# Patient Record
Sex: Female | Born: 1945 | Race: White | Hispanic: No | Marital: Married | Smoking: Never smoker
Health system: Southern US, Community
[De-identification: ages and names within clinical notes are randomized; demographics above are authoritative.]

## PROBLEM LIST (undated history)

## (undated) DIAGNOSIS — I878 Other specified disorders of veins: Secondary | ICD-10-CM

## (undated) DIAGNOSIS — M51369 Other intervertebral disc degeneration, lumbar region without mention of lumbar back pain or lower extremity pain: Secondary | ICD-10-CM

## (undated) DIAGNOSIS — M545 Low back pain, unspecified: Secondary | ICD-10-CM

## (undated) DIAGNOSIS — M1990 Unspecified osteoarthritis, unspecified site: Secondary | ICD-10-CM

## (undated) DIAGNOSIS — J189 Pneumonia, unspecified organism: Secondary | ICD-10-CM

## (undated) DIAGNOSIS — D126 Benign neoplasm of colon, unspecified: Secondary | ICD-10-CM

## (undated) DIAGNOSIS — R3915 Urgency of urination: Secondary | ICD-10-CM

## (undated) DIAGNOSIS — L309 Dermatitis, unspecified: Secondary | ICD-10-CM

## (undated) DIAGNOSIS — B029 Zoster without complications: Secondary | ICD-10-CM

## (undated) DIAGNOSIS — E7800 Pure hypercholesterolemia, unspecified: Secondary | ICD-10-CM

## (undated) DIAGNOSIS — A0472 Enterocolitis due to Clostridium difficile, not specified as recurrent: Secondary | ICD-10-CM

## (undated) DIAGNOSIS — N189 Chronic kidney disease, unspecified: Secondary | ICD-10-CM

## (undated) DIAGNOSIS — M5136 Other intervertebral disc degeneration, lumbar region: Secondary | ICD-10-CM

## (undated) DIAGNOSIS — M797 Fibromyalgia: Secondary | ICD-10-CM

## (undated) DIAGNOSIS — E669 Obesity, unspecified: Secondary | ICD-10-CM

## (undated) DIAGNOSIS — G8929 Other chronic pain: Secondary | ICD-10-CM

## (undated) DIAGNOSIS — M8580 Other specified disorders of bone density and structure, unspecified site: Secondary | ICD-10-CM

## (undated) DIAGNOSIS — F329 Major depressive disorder, single episode, unspecified: Secondary | ICD-10-CM

## (undated) DIAGNOSIS — R911 Solitary pulmonary nodule: Secondary | ICD-10-CM

## (undated) DIAGNOSIS — R0602 Shortness of breath: Secondary | ICD-10-CM

## (undated) DIAGNOSIS — D649 Anemia, unspecified: Secondary | ICD-10-CM

## (undated) DIAGNOSIS — K635 Polyp of colon: Secondary | ICD-10-CM

## (undated) DIAGNOSIS — R17 Unspecified jaundice: Secondary | ICD-10-CM

## (undated) DIAGNOSIS — E559 Vitamin D deficiency, unspecified: Secondary | ICD-10-CM

## (undated) DIAGNOSIS — E119 Type 2 diabetes mellitus without complications: Secondary | ICD-10-CM

## (undated) DIAGNOSIS — F32A Depression, unspecified: Secondary | ICD-10-CM

## (undated) HISTORY — PX: EYE SURGERY: SHX253

## (undated) HISTORY — PX: OOPHORECTOMY: SHX86

## (undated) HISTORY — PX: NASAL RECONSTRUCTION WITH SEPTAL REPAIR: SHX5665

## (undated) HISTORY — PX: TOTAL KNEE ARTHROPLASTY: SHX125

## (undated) HISTORY — PX: ORIF ANKLE FRACTURE: SHX5408

## (undated) HISTORY — PX: BREAST BIOPSY: SHX20

## (undated) HISTORY — PX: ANKLE FRACTURE SURGERY: SHX122

## (undated) HISTORY — PX: FRACTURE SURGERY: SHX138

## (undated) HISTORY — PX: BACK SURGERY: SHX140

## (undated) HISTORY — PX: CATARACT EXTRACTION W/ INTRAOCULAR LENS  IMPLANT, BILATERAL: SHX1307

## (undated) HISTORY — PX: OTHER SURGICAL HISTORY: SHX169

---
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
## 1964-09-10 HISTORY — PX: CHOLECYSTECTOMY: SHX55

## 1972-09-10 HISTORY — PX: ABDOMINAL HYSTERECTOMY: SHX81

## 2004-09-10 HISTORY — PX: KNEE ARTHROSCOPY: SHX127

## 2005-05-29 ENCOUNTER — Ambulatory Visit: Payer: Self-pay | Admitting: Family Medicine

## 2006-09-04 ENCOUNTER — Ambulatory Visit: Payer: Self-pay | Admitting: Obstetrics and Gynecology

## 2007-10-22 ENCOUNTER — Ambulatory Visit: Payer: Self-pay | Admitting: Rheumatology

## 2009-03-10 ENCOUNTER — Ambulatory Visit: Payer: Self-pay | Admitting: Family Medicine

## 2010-01-06 ENCOUNTER — Ambulatory Visit: Payer: Self-pay | Admitting: Unknown Physician Specialty

## 2010-03-20 ENCOUNTER — Ambulatory Visit: Payer: Self-pay | Admitting: Family Medicine

## 2010-12-28 ENCOUNTER — Ambulatory Visit: Payer: Self-pay | Admitting: Sports Medicine

## 2011-02-09 HISTORY — PX: MICRODISCECTOMY LUMBAR: SUR864

## 2011-02-16 ENCOUNTER — Ambulatory Visit: Payer: Self-pay | Admitting: Unknown Physician Specialty

## 2011-02-16 DIAGNOSIS — Z01810 Encounter for preprocedural cardiovascular examination: Secondary | ICD-10-CM

## 2011-02-22 ENCOUNTER — Ambulatory Visit: Payer: Self-pay | Admitting: Unknown Physician Specialty

## 2011-05-17 ENCOUNTER — Ambulatory Visit: Payer: Self-pay | Admitting: Family Medicine

## 2011-10-02 ENCOUNTER — Ambulatory Visit: Payer: Self-pay | Admitting: Unknown Physician Specialty

## 2011-10-02 LAB — CREATININE, SERUM
Creatinine: 0.88 mg/dL (ref 0.60–1.30)
EGFR (African American): >60
EGFR (Non-African Amer.): >60

## 2012-01-02 ENCOUNTER — Encounter (HOSPITAL_COMMUNITY): Payer: Self-pay | Admitting: Pharmacy Technician

## 2012-01-02 ENCOUNTER — Other Ambulatory Visit: Payer: Self-pay | Admitting: Neurosurgery

## 2012-01-10 ENCOUNTER — Other Ambulatory Visit: Payer: Self-pay | Admitting: Neurosurgery

## 2012-01-10 ENCOUNTER — Ambulatory Visit (HOSPITAL_COMMUNITY)
Admission: RE | Admit: 2012-01-10 | Discharge: 2012-01-10 | Disposition: A | Discharge status: Home / Self Care | Payer: Medicare Other | Source: Ambulatory Visit | Attending: Neurosurgery | Admitting: Neurosurgery

## 2012-01-10 ENCOUNTER — Encounter (HOSPITAL_COMMUNITY): Payer: Self-pay

## 2012-01-10 ENCOUNTER — Encounter (HOSPITAL_COMMUNITY)
Admission: RE | Admit: 2012-01-10 | Discharge: 2012-01-10 | Disposition: A | Discharge status: Home / Self Care | Payer: Medicare Other | Source: Ambulatory Visit | Attending: Neurosurgery | Admitting: Neurosurgery

## 2012-01-10 DIAGNOSIS — Z01812 Encounter for preprocedural laboratory examination: Secondary | ICD-10-CM | POA: Insufficient documentation

## 2012-01-10 DIAGNOSIS — Z01811 Encounter for preprocedural respiratory examination: Secondary | ICD-10-CM | POA: Insufficient documentation

## 2012-01-10 DIAGNOSIS — Z01810 Encounter for preprocedural cardiovascular examination: Secondary | ICD-10-CM | POA: Insufficient documentation

## 2012-01-10 DIAGNOSIS — Z01818 Encounter for other preprocedural examination: Secondary | ICD-10-CM | POA: Insufficient documentation

## 2012-01-10 HISTORY — DX: Unspecified osteoarthritis, unspecified site: M19.90

## 2012-01-10 HISTORY — DX: Pneumonia, unspecified organism: J18.9

## 2012-01-10 HISTORY — DX: Shortness of breath: R06.02

## 2012-01-10 LAB — BASIC METABOLIC PANEL
CO2: 26 mEq/L (ref 19–32)
Calcium: 9.8 mg/dL (ref 8.4–10.5)
Creatinine, Ser: 0.87 mg/dL (ref 0.50–1.10)
GFR calc non Af Amer: 68 mL/min — ABNORMAL LOW (ref >90)

## 2012-01-10 LAB — CBC
MCV: 89.2 fL (ref 78.0–100.0)
Platelets: 280 10*3/uL (ref 150–400)
RDW: 14.1 % (ref 11.5–15.5)
WBC: 11.1 10*3/uL — ABNORMAL HIGH (ref 4.0–10.5)

## 2012-01-10 LAB — TYPE AND SCREEN

## 2012-01-10 LAB — SURGICAL PCR SCREEN
MRSA, PCR: NEGATIVE
Staphylococcus aureus: NEGATIVE

## 2012-01-10 NOTE — Pre-Procedure Instructions (Signed)
20 Courtney Cherry  01/10/2012   Your procedure is scheduled on:  May 16. 2013 0730 AM  Report to [facility] at 0530 AM.  Call this number if you have problems the morning of surgery: [phone]   Remember:   Do not eat food:After Midnight.  May have clear liquids: up to 4 Hours before arrival.0130 AM  Clear liquids include soda, tea, black coffee, apple or grape juice, broth.  Take these medicines the morning of surgery with A SIP OF WATER: Vicodin   Do not wear jewelry, make-up or nail polish.  Do not wear lotions, powders, or perfumes. You may wear deodorant.  Do not shave 48 hours prior to surgery.  Do not bring valuables to the hospital.  Contacts, dentures or bridgework may not be worn into surgery.  Leave suitcase in the car. After surgery it may be brought to your room.  For patients admitted to the hospital, checkout time is 11:00 AM the day of discharge.   Patients discharged the day of surgery will not be allowed to drive home.    Special Instructions: CHG Shower Use Special Wash: 1/2 bottle night before surgery and 1/2 bottle morning of surgery.   Please read over the following fact sheets that you were given: Pain Booklet, Coughing and Deep Breathing, Blood Transfusion Information, MRSA Information and Surgical Site Infection Prevention

## 2012-01-17 ENCOUNTER — Other Ambulatory Visit: Payer: Self-pay | Admitting: Neurosurgery

## 2012-01-31 ENCOUNTER — Encounter (HOSPITAL_COMMUNITY): Payer: Self-pay | Admitting: *Deleted

## 2012-01-31 MED ORDER — DEXAMETHASONE SODIUM PHOSPHATE 10 MG/ML IJ SOLN: 10.0000 mg | INTRAMUSCULAR | Status: DC

## 2012-01-31 MED ORDER — DIPHENHYDRAMINE HCL 50 MG/ML IJ SOLN: 50.0000 mg | INTRAMUSCULAR | Status: DC

## 2012-01-31 MED ORDER — VANCOMYCIN HCL 500 MG IV SOLR
500.0000 mg | INTRAVENOUS | Status: DC
  Filled 2012-01-31: qty 500

## 2012-01-31 MED ORDER — TOBRAMYCIN SULFATE 80 MG/2ML IJ SOLN
80.0000 mg | INTRAVENOUS | Status: DC
  Filled 2012-01-31: qty 2

## 2012-01-31 NOTE — Pre-Procedure Instructions (Signed)
20 Courtney Cherry  01/31/2012   Your procedure is scheduled on:  Friday MAY 24TH   Report to [facility] at  0830 AM.  Call this number if you have problems the morning of surgery: [phone]   Remember:   Do not eat food:After Midnight.  THURSDAY  May have clear liquids: up to 4 Hours before arrival.  Clear liquids include soda, tea, black coffee, apple or grape juice, broth.  Take these medicines the morning of surgery with A SIP OF WATER: PAIN MEDICATIONS   Do not wear jewelry, make-up or nail polish.  Do not wear lotions, powders, or perfumes. You may wear deodorant.  Do not shave 48 hours prior to surgery. Men may shave face and neck.  Do not bring valuables to the hospital.  Contacts, dentures or bridgework may not be worn into surgery.  Leave suitcase in the car. After surgery it may be brought to your room.  For patients admitted to the hospital, checkout time is 11:00 AM the day of discharge.   Patients discharged the day of surgery will not be allowed to drive home.  Name and phone number of your driver   Courtney Cherry  --  SPOUSE  Special Instructions: CHG Shower Use Special Wash: 1/2 bottle night before surgery and 1/2 bottle morning of surgery.   Please read over the following fact sheets that you were given: Pain Booklet, MRSA Information and Surgical Site Infection Prevention     Pt  Has this information from prior work up.

## 2012-02-01 ENCOUNTER — Encounter (HOSPITAL_COMMUNITY): Payer: Self-pay | Admitting: *Deleted

## 2012-02-01 ENCOUNTER — Encounter (HOSPITAL_COMMUNITY)
Admission: RE | Disposition: A | Discharge status: Home / Self Care | Payer: Self-pay | Source: Ambulatory Visit | Attending: Neurosurgery

## 2012-02-01 ENCOUNTER — Ambulatory Visit (HOSPITAL_COMMUNITY): Payer: Medicare Other | Admitting: Anesthesiology

## 2012-02-01 ENCOUNTER — Inpatient Hospital Stay (HOSPITAL_COMMUNITY)
Admission: RE | Admit: 2012-02-01 | Discharge: 2012-02-03 | DRG: 460 | Disposition: A | Discharge status: Home / Self Care | Payer: Medicare Other | Source: Ambulatory Visit | Attending: Neurosurgery | Admitting: Neurosurgery

## 2012-02-01 ENCOUNTER — Encounter (HOSPITAL_COMMUNITY): Payer: Self-pay | Admitting: Anesthesiology

## 2012-02-01 ENCOUNTER — Encounter (HOSPITAL_COMMUNITY): Payer: Self-pay | Admitting: General Practice

## 2012-02-01 ENCOUNTER — Ambulatory Visit (HOSPITAL_COMMUNITY): Payer: Medicare Other

## 2012-02-01 DIAGNOSIS — Z882 Allergy status to sulfonamides status: Secondary | ICD-10-CM

## 2012-02-01 DIAGNOSIS — M47817 Spondylosis without myelopathy or radiculopathy, lumbosacral region: Principal | ICD-10-CM | POA: Diagnosis present

## 2012-02-01 DIAGNOSIS — Z79899 Other long term (current) drug therapy: Secondary | ICD-10-CM

## 2012-02-01 DIAGNOSIS — Z981 Arthrodesis status: Secondary | ICD-10-CM

## 2012-02-01 DIAGNOSIS — M5126 Other intervertebral disc displacement, lumbar region: Secondary | ICD-10-CM | POA: Diagnosis present

## 2012-02-01 HISTORY — DX: Depression, unspecified: F32.A

## 2012-02-01 HISTORY — DX: Fibromyalgia: M79.7

## 2012-02-01 HISTORY — DX: Major depressive disorder, single episode, unspecified: F32.9

## 2012-02-01 HISTORY — DX: Low back pain, unspecified: M54.50

## 2012-02-01 HISTORY — DX: Other chronic pain: G89.29

## 2012-02-01 HISTORY — DX: Unspecified jaundice: R17

## 2012-02-01 HISTORY — PX: POSTERIOR FUSION LUMBAR SPINE: SUR632

## 2012-02-01 HISTORY — DX: Anemia, unspecified: D64.9

## 2012-02-01 HISTORY — DX: Low back pain: M54.5

## 2012-02-01 HISTORY — DX: Urgency of urination: R39.15

## 2012-02-01 LAB — TYPE AND SCREEN

## 2012-02-01 LAB — CBC
HCT: 42.1 % (ref 36.0–46.0)
MCH: 29.8 pg (ref 26.0–34.0)
MCHC: 33.5 g/dL (ref 30.0–36.0)
MCV: 89 fL (ref 78.0–100.0)
Platelets: 271 10*3/uL (ref 150–400)
RDW: 13.9 % (ref 11.5–15.5)

## 2012-02-01 LAB — BASIC METABOLIC PANEL
BUN: 13 mg/dL (ref 6–23)
CO2: 23 mEq/L (ref 19–32)
Calcium: 9.7 mg/dL (ref 8.4–10.5)
Chloride: 105 mEq/L (ref 96–112)
Creatinine, Ser: 0.85 mg/dL (ref 0.50–1.10)
Glucose, Bld: 117 mg/dL — ABNORMAL HIGH (ref 70–99)

## 2012-02-01 SURGERY — POSTERIOR LUMBAR FUSION 2 LEVEL
Anesthesia: General | Site: Spine Lumbar | Wound class: Clean

## 2012-02-01 MED ORDER — MENTHOL 3 MG MT LOZG: 1.0000 | LOZENGE | OROMUCOSAL | Status: DC | PRN

## 2012-02-01 MED ORDER — DIPHENHYDRAMINE HCL 50 MG/ML IJ SOLN: 50.0000 mg | INTRAMUSCULAR | Status: DC

## 2012-02-01 MED ORDER — DIPHENHYDRAMINE HCL 50 MG/ML IJ SOLN
INTRAMUSCULAR | Status: AC
  Administered 2012-02-01: 50 mg via INTRAVENOUS
  Filled 2012-02-01: qty 1

## 2012-02-01 MED ORDER — SODIUM CHLORIDE 0.9 % IV SOLN
INTRAVENOUS | Status: AC
  Filled 2012-02-01: qty 500

## 2012-02-01 MED ORDER — PHENOL 1.4 % MT LIQD: 1.0000 | OROMUCOSAL | Status: DC | PRN

## 2012-02-01 MED ORDER — ACETAMINOPHEN 650 MG RE SUPP: 650.0000 mg | RECTAL | Status: DC | PRN

## 2012-02-01 MED ORDER — SODIUM CHLORIDE 0.9 % IJ SOLN
3.0000 mL | Freq: Two times a day (BID) | INTRAMUSCULAR | Status: DC
  Administered 2012-02-01 – 2012-02-02 (×2): 3 mL via INTRAVENOUS

## 2012-02-01 MED ORDER — VANCOMYCIN HCL IN DEXTROSE 1-5 GM/200ML-% IV SOLN
1000.0000 mg | Freq: Two times a day (BID) | INTRAVENOUS | Status: DC
  Administered 2012-02-01 – 2012-02-03 (×4): 1000 mg via INTRAVENOUS
  Filled 2012-02-01 (×5): qty 200

## 2012-02-01 MED ORDER — HYDROMORPHONE HCL PF 1 MG/ML IJ SOLN
INTRAMUSCULAR | Status: AC
  Filled 2012-02-01: qty 1

## 2012-02-01 MED ORDER — DEXAMETHASONE SODIUM PHOSPHATE 4 MG/ML IJ SOLN
4.0000 mg | Freq: Four times a day (QID) | INTRAMUSCULAR | Status: AC
  Administered 2012-02-01: 4 mg via INTRAVENOUS
  Filled 2012-02-01: qty 1

## 2012-02-01 MED ORDER — DEXAMETHASONE SODIUM PHOSPHATE 10 MG/ML IJ SOLN: 10.0000 mg | INTRAMUSCULAR | Status: DC

## 2012-02-01 MED ORDER — PROPOFOL 10 MG/ML IV EMUL
INTRAVENOUS | Status: DC | PRN
  Administered 2012-02-01: 150 mg via INTRAVENOUS

## 2012-02-01 MED ORDER — BACITRACIN 50000 UNITS IM SOLR
INTRAMUSCULAR | Status: AC
  Filled 2012-02-01: qty 1

## 2012-02-01 MED ORDER — NEOSTIGMINE METHYLSULFATE 1 MG/ML IJ SOLN
INTRAMUSCULAR | Status: DC | PRN
  Administered 2012-02-01: 5 mg via INTRAVENOUS

## 2012-02-01 MED ORDER — PHENYLEPHRINE HCL 10 MG/ML IJ SOLN
20.0000 mg | INTRAMUSCULAR | Status: DC | PRN
  Administered 2012-02-01: 35 ug/min via INTRAVENOUS

## 2012-02-01 MED ORDER — LACTATED RINGERS IV SOLN
INTRAVENOUS | Status: DC | PRN
  Administered 2012-02-01 (×2): via INTRAVENOUS

## 2012-02-01 MED ORDER — SODIUM CHLORIDE 0.9 % IV SOLN
INTRAVENOUS | Status: DC | PRN
  Administered 2012-02-01: 16:00:00 via INTRAVENOUS

## 2012-02-01 MED ORDER — ONDANSETRON HCL 4 MG/2ML IJ SOLN: 4.0000 mg | INTRAMUSCULAR | Status: DC | PRN

## 2012-02-01 MED ORDER — DEXAMETHASONE 4 MG PO TABS
4.0000 mg | ORAL_TABLET | Freq: Four times a day (QID) | ORAL | Status: AC
  Administered 2012-02-02: 4 mg via ORAL
  Filled 2012-02-01 (×2): qty 1

## 2012-02-01 MED ORDER — MORPHINE SULFATE 4 MG/ML IJ SOLN: 0.0500 mg/kg | INTRAMUSCULAR | Status: DC | PRN

## 2012-02-01 MED ORDER — ONDANSETRON HCL 4 MG/2ML IJ SOLN
INTRAMUSCULAR | Status: DC | PRN
  Administered 2012-02-01: 4 mg via INTRAVENOUS

## 2012-02-01 MED ORDER — ACETAMINOPHEN 325 MG PO TABS: 650.0000 mg | ORAL_TABLET | ORAL | Status: DC | PRN

## 2012-02-01 MED ORDER — HYDROMORPHONE HCL PF 1 MG/ML IJ SOLN
0.2500 mg | INTRAMUSCULAR | Status: DC | PRN
  Administered 2012-02-01 (×2): 0.5 mg via INTRAVENOUS

## 2012-02-01 MED ORDER — CYCLOBENZAPRINE HCL 10 MG PO TABS
10.0000 mg | ORAL_TABLET | Freq: Three times a day (TID) | ORAL | Status: DC | PRN
  Administered 2012-02-01: 10 mg via ORAL
  Filled 2012-02-01: qty 1

## 2012-02-01 MED ORDER — HETASTARCH-ELECTROLYTES 6 % IV SOLN
INTRAVENOUS | Status: DC | PRN
  Administered 2012-02-01 (×2): via INTRAVENOUS

## 2012-02-01 MED ORDER — DEXAMETHASONE SODIUM PHOSPHATE 10 MG/ML IJ SOLN
INTRAMUSCULAR | Status: AC
  Administered 2012-02-01: 10 mg via INTRAVENOUS
  Filled 2012-02-01: qty 1

## 2012-02-01 MED ORDER — BUPIVACAINE HCL (PF) 0.5 % IJ SOLN
INTRAMUSCULAR | Status: DC | PRN
  Administered 2012-02-01: 30 mL

## 2012-02-01 MED ORDER — ZOLPIDEM TARTRATE 5 MG PO TABS: 5.0000 mg | ORAL_TABLET | Freq: Every evening | ORAL | Status: DC | PRN

## 2012-02-01 MED ORDER — HYDROCODONE-ACETAMINOPHEN 5-325 MG PO TABS
1.0000 | ORAL_TABLET | ORAL | Status: DC | PRN
  Administered 2012-02-01: 1 via ORAL
  Administered 2012-02-02 – 2012-02-03 (×5): 2 via ORAL
  Filled 2012-02-01 (×6): qty 2

## 2012-02-01 MED ORDER — THROMBIN 20000 UNITS EX KIT
PACK | CUTANEOUS | Status: DC | PRN
  Administered 2012-02-01: 13:00:00 via TOPICAL

## 2012-02-01 MED ORDER — SUFENTANIL CITRATE 50 MCG/ML IV SOLN
INTRAVENOUS | Status: DC | PRN
  Administered 2012-02-01 (×3): 10 ug via INTRAVENOUS
  Administered 2012-02-01: 5 ug via INTRAVENOUS
  Administered 2012-02-01 (×2): 10 ug via INTRAVENOUS
  Administered 2012-02-01: 5 ug via INTRAVENOUS
  Administered 2012-02-01: 20 ug via INTRAVENOUS
  Administered 2012-02-01 (×2): 10 ug via INTRAVENOUS

## 2012-02-01 MED ORDER — KCL IN DEXTROSE-NACL 20-5-0.45 MEQ/L-%-% IV SOLN
80.0000 mL/h | INTRAVENOUS | Status: DC
  Administered 2012-02-01: 80 mL/h via INTRAVENOUS
  Filled 2012-02-01 (×5): qty 1000

## 2012-02-01 MED ORDER — VANCOMYCIN HCL 500 MG IV SOLR
500.0000 mg | INTRAVENOUS | Status: AC
  Administered 2012-02-01: 500 mg via INTRAVENOUS
  Filled 2012-02-01: qty 500

## 2012-02-01 MED ORDER — PANTOPRAZOLE SODIUM 40 MG IV SOLR
40.0000 mg | Freq: Every day | INTRAVENOUS | Status: DC
Start: 2012-02-01 — End: 2012-02-03
  Administered 2012-02-01 – 2012-02-02 (×2): 40 mg via INTRAVENOUS
  Filled 2012-02-01 (×3): qty 40

## 2012-02-01 MED ORDER — GLYCOPYRROLATE 0.2 MG/ML IJ SOLN
INTRAMUSCULAR | Status: DC | PRN
  Administered 2012-02-01: .7 mg via INTRAVENOUS

## 2012-02-01 MED ORDER — TOBRAMYCIN SULFATE 80 MG/2ML IJ SOLN
80.0000 mg | INTRAVENOUS | Status: AC
  Administered 2012-02-01: 80 mg via INTRAVENOUS
  Filled 2012-02-01: qty 2

## 2012-02-01 MED ORDER — SODIUM CHLORIDE 0.9 % IV SOLN: 250.0000 mL | INTRAVENOUS | Status: DC

## 2012-02-01 MED ORDER — ROCURONIUM BROMIDE 100 MG/10ML IV SOLN
INTRAVENOUS | Status: DC | PRN
  Administered 2012-02-01: 15 mg via INTRAVENOUS
  Administered 2012-02-01: 5 mg via INTRAVENOUS
  Administered 2012-02-01: 20 mg via INTRAVENOUS
  Administered 2012-02-01 (×4): 10 mg via INTRAVENOUS
  Administered 2012-02-01: 50 mg via INTRAVENOUS
  Administered 2012-02-01: 10 mg via INTRAVENOUS

## 2012-02-01 MED ORDER — SODIUM CHLORIDE 0.9 % IJ SOLN: 3.0000 mL | INTRAMUSCULAR | Status: DC | PRN

## 2012-02-01 MED ORDER — HYDROMORPHONE HCL PF 1 MG/ML IJ SOLN
1.0000 mg | INTRAMUSCULAR | Status: DC | PRN
  Administered 2012-02-01: 1 mg via INTRAMUSCULAR
  Filled 2012-02-01: qty 1

## 2012-02-01 MED ORDER — SODIUM CHLORIDE 0.9 % IR SOLN
Status: DC | PRN
  Administered 2012-02-01: 13:00:00

## 2012-02-01 MED ORDER — 0.9 % SODIUM CHLORIDE (POUR BTL) OPTIME
TOPICAL | Status: DC | PRN
  Administered 2012-02-01: 1000 mL

## 2012-02-01 SURGICAL SUPPLY — 65 items
BAG DECANTER FOR FLEXI CONT (MISCELLANEOUS) ×2 IMPLANT
BENZOIN TINCTURE PRP APPL 2/3 (GAUZE/BANDAGES/DRESSINGS) ×2 IMPLANT
BLADE SURG ROTATE 9660 (MISCELLANEOUS) IMPLANT
BONE EQUIVA 10CC (Bone Implant) ×2 IMPLANT
BRUSH SCRUB EZ PLAIN DRY (MISCELLANEOUS) ×2 IMPLANT
CANISTER SUCTION 2500CC (MISCELLANEOUS) ×2 IMPLANT
CLOTH BEACON ORANGE TIMEOUT ST (SAFETY) ×2 IMPLANT
CONT SPEC 4OZ CLIKSEAL STRL BL (MISCELLANEOUS) ×4 IMPLANT
COVER BACK TABLE 24X17X13 BIG (DRAPES) IMPLANT
COVER TABLE BACK 60X90 (DRAPES) ×2 IMPLANT
DERMABOND ADVANCED (GAUZE/BANDAGES/DRESSINGS)
DERMABOND ADVANCED .7 DNX12 (GAUZE/BANDAGES/DRESSINGS) IMPLANT
DRAPE C-ARM 42X72 X-RAY (DRAPES) ×4 IMPLANT
DRAPE LAPAROTOMY 100X72X124 (DRAPES) ×2 IMPLANT
DRAPE SURG 17X23 STRL (DRAPES) ×4 IMPLANT
DRESSING TELFA 8X3 (GAUZE/BANDAGES/DRESSINGS) ×2 IMPLANT
ELECT REM PT RETURN 9FT ADLT (ELECTROSURGICAL) ×2
ELECTRODE REM PT RTRN 9FT ADLT (ELECTROSURGICAL) ×1 IMPLANT
EVACUATOR 1/8 PVC DRAIN (DRAIN) ×2 IMPLANT
GAUZE SPONGE 4X4 16PLY XRAY LF (GAUZE/BANDAGES/DRESSINGS) IMPLANT
GLOVE BIO SURGEON STRL SZ8 (GLOVE) ×2 IMPLANT
GLOVE BIOGEL PI IND STRL 7.0 (GLOVE) ×1 IMPLANT
GLOVE BIOGEL PI IND STRL 7.5 (GLOVE) ×1 IMPLANT
GLOVE BIOGEL PI IND STRL 8.5 (GLOVE) ×3 IMPLANT
GLOVE BIOGEL PI INDICATOR 7.0 (GLOVE) ×1
GLOVE BIOGEL PI INDICATOR 7.5 (GLOVE) ×1
GLOVE BIOGEL PI INDICATOR 8.5 (GLOVE) ×3
GLOVE ECLIPSE 7.5 STRL STRAW (GLOVE) ×8 IMPLANT
GLOVE EXAM NITRILE MD LF STRL (GLOVE) ×2 IMPLANT
GLOVE SURG SS PI 8.0 STRL IVOR (GLOVE) ×6 IMPLANT
GOWN BRE IMP SLV AUR LG STRL (GOWN DISPOSABLE) IMPLANT
GOWN BRE IMP SLV AUR XL STRL (GOWN DISPOSABLE) IMPLANT
GOWN STRL REIN 2XL LVL4 (GOWN DISPOSABLE) IMPLANT
GRAFT PLIF 10X9MM (Graft) ×2 IMPLANT
KIT BASIN OR (CUSTOM PROCEDURE TRAY) ×2 IMPLANT
KIT ROOM TURNOVER OR (KITS) ×2 IMPLANT
LUCENT STRAIGHT 10X22X10 (Set) ×2 IMPLANT
MARKER SKIN DUAL TIP RULER LAB (MISCELLANEOUS) ×2 IMPLANT
NEEDLE HYPO 22GX1.5 SAFETY (NEEDLE) ×2 IMPLANT
NS IRRIG 1000ML POUR BTL (IV SOLUTION) ×2 IMPLANT
PACK LAMINECTOMY NEURO (CUSTOM PROCEDURE TRAY) ×2 IMPLANT
PAD ARMBOARD 7.5X6 YLW CONV (MISCELLANEOUS) ×10 IMPLANT
PATTIES SURGICAL .75X.75 (GAUZE/BANDAGES/DRESSINGS) ×2 IMPLANT
ROD PREBENT 75MM (Rod) ×4 IMPLANT
SCREW SEQUOIA 6.5X35MM (Screw) ×4 IMPLANT
SCREW SEQUOIA 7.5X40 (Screw) ×8 IMPLANT
SPEEDLINK LRG (Rod) ×2 IMPLANT
SPONGE GAUZE 4X4 12PLY (GAUZE/BANDAGES/DRESSINGS) ×2 IMPLANT
SPONGE LAP 4X18 X RAY DECT (DISPOSABLE) IMPLANT
SPONGE SURGIFOAM ABS GEL 100 (HEMOSTASIS) ×2 IMPLANT
STRIP CLOSURE SKIN 1/2X4 (GAUZE/BANDAGES/DRESSINGS) ×2 IMPLANT
SUT VIC AB 0 CT1 18XCR BRD8 (SUTURE) ×1 IMPLANT
SUT VIC AB 0 CT1 8-18 (SUTURE) ×1
SUT VIC AB 2-0 OS6 18 (SUTURE) ×6 IMPLANT
SUT VIC AB 3-0 CP2 18 (SUTURE) ×2 IMPLANT
SYR 20ML ECCENTRIC (SYRINGE) ×2 IMPLANT
TAPE CLOTH SURG 4X10 WHT LF (GAUZE/BANDAGES/DRESSINGS) ×2 IMPLANT
TOOL FLUTED BALL 7MM (MISCELLANEOUS) ×2 IMPLANT
TOOL MATCHSTK 3MM (MISCELLANEOUS) ×2 IMPLANT
TOP CLSR SEQUOIA (Orthopedic Implant) ×12 IMPLANT
TOWEL OR 17X24 6PK STRL BLUE (TOWEL DISPOSABLE) IMPLANT
TOWEL OR 17X26 10 PK STRL BLUE (TOWEL DISPOSABLE) IMPLANT
TRAP SPECIMEN MUCOUS 40CC (MISCELLANEOUS) ×2 IMPLANT
TRAY FOLEY CATH 14FRSI W/METER (CATHETERS) ×2 IMPLANT
WATER STERILE IRR 1000ML POUR (IV SOLUTION) ×2 IMPLANT

## 2012-02-01 NOTE — Brief Op Note (Signed)
02/01/2012  4:25 PM  PATIENT:  Courtney Cherry  66 y.o. female  PRE-OPERATIVE DIAGNOSIS:  stenosis/listhesis  POST-OPERATIVE DIAGNOSIS:  Stenosis/Listhesis  PROCEDURE:  Procedure(s) (LRB): POSTERIOR LUMBAR FUSION 2 LEVEL (N/A)  SURGEON:  Surgeon(s) and Role:    * Reinaldo Meeker, MD - Primary    * Tia Alert, MD - Assisting  PHYSICIAN ASSISTANT:   ASSISTANTS: Yetta Barre   ANESTHESIA:   general  EBL:  Total I/O In: 2300 [I.V.:1200; Blood:100; IV Piggyback:1000] Out: 900 [Urine:550; Blood:350]  BLOOD ADMINISTERED:none  DRAINS: (medium) Hemovact drain(s) in the epidural with  Suction Open   LOCAL MEDICATIONS USED:  MARCAINE     SPECIMEN:  No Specimen  DISPOSITION OF SPECIMEN:  N/A  COUNTS:  YES  TOURNIQUET:  * No tourniquets in log *  DICTATION: .Dragon Dictation  PLAN OF CARE: Admit to inpatient   PATIENT DISPOSITION:  PACU - hemodynamically stable.   Delay start of Pharmacological VTE agent (>24hrs) due to surgical blood loss or risk of bleeding: yes

## 2012-02-01 NOTE — Anesthesia Preprocedure Evaluation (Signed)
Anesthesia Evaluation  Patient identified by MRN, date of birth, ID band Patient awake    Airway Mallampati: II      Dental   Pulmonary          Cardiovascular negative cardio ROS  Rhythm:Regular Rate:Normal     Neuro/Psych    GI/Hepatic negative GI ROS, Neg liver ROS,   Endo/Other  negative endocrine ROS  Renal/GU negative Renal ROS     Musculoskeletal   Abdominal   Peds  Hematology   Anesthesia Other Findings   Reproductive/Obstetrics                           Anesthesia Physical Anesthesia Plan  ASA: III  Anesthesia Plan: General   Post-op Pain Management:    Induction:   Airway Management Planned: Oral ETT  Additional Equipment:   Intra-op Plan:   Post-operative Plan: Extubation in OR  Informed Consent: I have reviewed the patients History and Physical, chart, labs and discussed the procedure including the risks, benefits and alternatives for the proposed anesthesia with the patient or authorized representative who has indicated his/her understanding and acceptance.     Plan Discussed with:   Anesthesia Plan Comments:         Anesthesia Quick Evaluation

## 2012-02-01 NOTE — Progress Notes (Signed)
ANTIBIOTIC CONSULT NOTE - INITIAL  Pharmacy Consult for vancomycin Indication: post-op prophylaxis  Allergies  Allergen Reactions  . Adhesive (Tape)   . Sulfa Antibiotics   . Valium (Diazepam)     Patient Measurements: Weight: 243 lb (110.224 kg)  Vital Signs: Temp: 97.4 F (36.3 C) (05/24 1739) Temp src: Oral (05/24 1739) BP: 148/79 mmHg (05/24 1739) Pulse Rate: 87  (05/24 1739) Intake/Output from previous day:   Intake/Output from this shift: Total I/O In: 2300 [I.V.:1200; Blood:100; IV Piggyback:1000] Out: 1060 [Urine:660; Drains:50; Blood:350]  Labs:  [facility] 02/01/12 0913  WBC 7.4  HGB 14.1  PLT 271  LABCREA --  CREATININE 0.85   CrCl is unknown because there is no height on file for the current visit. No results found for this basename: VANCOTROUGH:2,VANCOPEAK:2,VANCORANDOM:2,GENTTROUGH:2,GENTPEAK:2,GENTRANDOM:2,TOBRATROUGH:2,TOBRAPEAK:2,TOBRARND:2,AMIKACINPEAK:2,AMIKACINTROU:2,AMIKACIN:2, in the last 72 hours   Microbiology: Recent Results (from the past 720 hour(s))  SURGICAL PCR SCREEN     Status: Normal   Collection Time   01/10/12  2:49 PM      Component Value Range Status Comment   MRSA, PCR NEGATIVE  NEGATIVE  Final    Staphylococcus aureus NEGATIVE  NEGATIVE  Final     Medical History: Past Medical History  Diagnosis Date  . Pneumonia     hx last time 2-3 years ago  . Shortness of breath     exertion due to back  . Arthritis     Medications:  Scheduled:    . bacitracin      . dexamethasone      . dexamethasone  4 mg Intravenous Q6H   Or  . dexamethasone  4 mg Oral Q6H  . diphenhydrAMINE      . HYDROmorphone      . pantoprazole (PROTONIX) IV  40 mg Intravenous QHS  . sodium chloride      . sodium chloride  3 mL Intravenous Q12H  . tobramycin  80 mg Intravenous To 282  . vancomycin  500 mg Intravenous To 282  . DISCONTD: dexamethasone  10 mg Intravenous To OR  . DISCONTD: dexamethasone  10 mg Intravenous To OR  . DISCONTD:  diphenhydrAMINE  50 mg Intravenous To OR  . DISCONTD: diphenhydrAMINE  50 mg Intravenous To OR  . DISCONTD: tobramycin  80 mg Intravenous To OR  . DISCONTD: vancomycin  500 mg Intravenous To OR   Assessment: 66 yo female to begin vancomycin for post-op prophylaxis s/p posterior lumbar fusion with drain placement.  Patient received vancomycin 500 mg IV pre-op at 11:50.  Goal of Therapy:  Vancomycin trough level 10-15 mcg/ml  Plan:  Vancomycin 1000 mg IV q12h Will follow-up renal function and adjust as needed  [facility], [place].D., BCPS Clinical Pharmacist 02/01/2012 6:13 PM

## 2012-02-01 NOTE — H&P (Signed)
Courtney Cherry is an 66 y.o. female.   Chief Complaint: Back and leg pain HPI: The patient is a 66 year old female who had a back fusion many years ago at L4-5. Showed a posterior lateral fusion with instrumentation but no interbody. 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 She was tried on conservative therapy without improvement. After failing further conservative therapy due to the marked abnormalities noted on her scan she requested surgery at this time. I had a long discussion with her regarding the risks and benefits of surgical intervention. The risks discussed include but are not limited to bleeding infection weakness numbness paralysis spinal fluid leakage trouble with instrumentation nonunion coma and death. We have discussed alternative methods of therapy along with risks and benefits of nonintervention. She's had the opportunity to rest numerous questions and appears to understand. With this information in hand she has requested we proceed with surgery.  Past Medical History  Diagnosis Date  . Pneumonia     hx last time 2-3 years ago  . Shortness of breath     exertion due to back  . Arthritis     Past Surgical History  Procedure Date  . Cholecystectomy     age 42yrs  . Tonsillectomy     age5  . Abdominal hysterectomy     age 59 yrs  . Ankle fracture surgery     left screws and plate 1610  and removed 1999  . Knee arthroscopy     left knee 2006  . Back surgery     fusion 2008 L4-L5  . Back surgery   . Micro back   . Microdiscectomy lumbar 2012  . Eye surgery     3 surgeries left 2010,and 1 right eye 2009  . Left eye     H/O  TORN  IRIS ON LEFT  EYE....AFTER REDO CATARACT...    Family History  Problem Relation Age of Onset  . Anesthesia problems Neg Hx   . Hypotension Neg Hx   . Malignant hyperthermia Neg Hx   . Pseudochol deficiency Neg Hx    Social History:  reports that she has never smoked. She has never used  smokeless tobacco. She reports that she does not drink alcohol or use illicit drugs.  Allergies:  Allergies  Allergen Reactions  . Adhesive (Tape)   . Sulfa Antibiotics   . Valium (Diazepam)     Medications Prior to Admission  Medication Sig Dispense Refill  . acetaminophen (TYLENOL) 500 MG tablet Take 1,000 mg by mouth every 4 (four) hours as needed. For pain      . CALCIUM-MAGNESIUM-VITAMIN D PO Take 1 tablet by mouth 2 (two) times daily.      . Camphor-Menthol-Capsicum (TIGER BALM PAIN RELIEVING) 80-24-16 MG PTCH Apply 1 patch topically every morning.      Marland Kitchen COENZYME Q-10 PO Take 1 tablet by mouth daily.      Marland Kitchen etodolac (LODINE) 500 MG tablet Take 500 mg by mouth 2 (two) times daily.      . fish oil-omega-3 fatty acids 1000 MG capsule Take 1 g by mouth daily.      . Glucosamine-Chondroitin (GLUCOSAMINE CHONDR COMPLEX PO) Take 5 tablets by mouth daily.      Marland Kitchen GREEN COFFEE BEAN PO Take 1 tablet by mouth 2 (two) times daily before a meal.      . HYDROcodone-acetaminophen (VICODIN) 5-500 MG per tablet Take 1 tablet by mouth every 4 (four) hours  as needed. For pain      . OVER THE COUNTER MEDICATION Take 1 tablet by mouth daily. "Solar #7" herbal pain medication      . traMADol (ULTRAM) 50 MG tablet Take 100 mg by mouth at bedtime as needed. For pain        Results for orders placed during the hospital encounter of 02/01/12 (from the past 48 hour(s))  BASIC METABOLIC PANEL     Status: Abnormal   Collection Time   02/01/12  9:13 AM      Component Value Range Comment   Sodium 139  135 - 145 (mEq/L)    Potassium 4.1  3.5 - 5.1 (mEq/L)    Chloride 105  96 - 112 (mEq/L)    CO2 23  19 - 32 (mEq/L)    Glucose, Bld 117 (*) 70 - 99 (mg/dL)    BUN 13  6 - 23 (mg/dL)    Creatinine, Ser 9.60  0.50 - 1.10 (mg/dL)    Calcium 9.7  8.4 - 10.5 (mg/dL)    GFR calc non Af Amer 70 (*) >90 (mL/min)    GFR calc Af Amer 81 (*) >90 (mL/min)   CBC     Status: Normal   Collection Time   02/01/12  9:13 AM       Component Value Range Comment   WBC 7.4  4.0 - 10.5 (K/uL)    RBC 4.73  3.87 - 5.11 (MIL/uL)    Hemoglobin 14.1  12.0 - 15.0 (g/dL)    HCT 45.4  09.8 - 11.9 (%)    MCV 89.0  78.0 - 100.0 (fL)    MCH 29.8  26.0 - 34.0 (pg)    MCHC 33.5  30.0 - 36.0 (g/dL)    RDW 14.7  82.9 - 56.2 (%)    Platelets 271  150 - 400 (K/uL)   TYPE AND SCREEN     Status: Normal   Collection Time   02/01/12  9:20 AM      Component Value Range Comment   ABO/RH(D) A POS      Antibody Screen NEG      Sample Expiration 02/04/2012      No results found.  A comprehensive review of systems was negative.  Blood pressure 138/62, pulse 89, temperature 98.8 F (37.1 C), temperature source Oral, resp. rate 18, weight 110.224 kg (243 lb), SpO2 96.00%.  The patient is awake alert and oriented with no facial asymmetry. Her gait is mildly antalgic. Her strength is 5 over 5 and sensation is intact. She has decreased ankle reflex bilaterally. Assessment/Plan Impression is that of adjacent level disease as well as persistent stenosis at L4-5 and L5-S1. The plan is for an L4-5 decompression and L5-S1 decompression and L5 S1 iinstrumented fusion.  Reinaldo Meeker, MD 02/01/2012, 11:17 AM

## 2012-02-01 NOTE — Transfer of Care (Signed)
Immediate Anesthesia Transfer of Care Note  Patient: Courtney Cherry  Procedure(s) Performed: Procedure(s) (LRB): POSTERIOR LUMBAR FUSION 2 LEVEL (N/A)  Patient Location: PACU  Anesthesia Type: General  Level of Consciousness: awake and alert   Airway & Oxygen Therapy: Patient Spontanous Breathing and Patient connected to face mask oxygen  Post-op Assessment: Report given to PACU RN, Post -op Vital signs reviewed and stable, Patient moving all extremities and Patient moving all extremities X 4  Post vital signs: Reviewed and stable  Complications: No apparent anesthesia complications

## 2012-02-01 NOTE — Anesthesia Postprocedure Evaluation (Signed)
  Anesthesia Post-op Note  Patient: Courtney Cherry  Procedure(s) Performed: Procedure(s) (LRB): POSTERIOR LUMBAR FUSION 2 LEVEL (N/A)  Patient Location: PACU  Anesthesia Type: General  Level of Consciousness: awake, alert  and oriented  Airway and Oxygen Therapy: Patient Spontanous Breathing  Post-op Pain: mild  Post-op Assessment: Post-op Vital signs reviewed  Post-op Vital Signs: Reviewed  Complications: No apparent anesthesia complications

## 2012-02-01 NOTE — Op Note (Signed)
Preop diagnosis: Spinal stenosis L4-5 L5-S1 status post L4-5 fusion with L5-S1 right herniated disc Postop diagnosis: Same Procedure: Exploration of L4-5 fusion with bilateral L4-5 and L5-S1 decompressive laminectomy for decompression of L5 and S1 nerve roots more so than needed for interbody fusion followed by bilateral L5-S1 microdiscectomy followed by L5-S1 posterior lumbar interbody fusion with peek interbody spacer and Zimmer bony spacer followed by segmental instrumentation L4-5 L5-S1 with Sequoia pedicle screws mentation and L5-S1 posterolateral fusion Surgeon: Insurance risk surveyor: Yetta Barre  After being placed in the prone position the patient's back was prepped and draped in the usual sterile fashion. Previous lumbar incision was opened up and carried down to the spinous processes that were remaining. We did subperiosteal dissection along the spinous process and lamina of S1 and dissected superiorly to identify the old instrumentation at L4-5 bilaterally. We then identified the residual bone of L5 lamina and S1 lamina. We then removed the old instrumentation at L4-5 inspected the posterior lateral fusion which was found to be extremely solid. We replaced the screws at L4 and L5 bilaterally with new Sequoia instrumentation with a 7 5 x 40 mm screws bilaterally at both levels. We then did an aggressive laminectomy of the inferior half of the L4 lamina the entire L5 lamina and the superior one half of the S1 lamina. At L5-S1 the medial three quarters of the facet joint removed as well. We then entered the disc space at L5-S1 and thoroughly cleaned out bilaterally. There is a very large ruptured disc on the right side and this was cleaned out aggressively. We then prepared the L5-S1 disc for posterior lumbar interbody fusion. We distracted up to a 10 mm size and then used rotating cutters. We placed a 10 mm peek interbody spacer on the opposite side placed a Zimmer bony spacer. Prior to placing the second  spacer we placed a mixture of autologous bone morselized allograft in the midline to help with interbody fusion. We then put pedicle screws at S1 in standard fashion. We placed 6.5 x 35 mm screws at that level bilaterally. We then decorticated the far lateral region for posterolateral fusion with a mixture of autologous bone morselized allograft. We then irrigated copiously. We chose appropriate length rods and secured into the top of the screws and they did tightening and final tightening with torque and counter torque. We did compress L5 on S1 bilaterally. Placed a cross-link as well. Final fluoroscopy the excellent DepoDur posterolateral fusion. We then copious once more and controlled any bleeding with upper coagulation Gelfoam. Left an epidural drain in the epidural space and brought out through a separate stab wound incision. We then closed the incision in multiple layers of Vicryl on the muscle fascia subcutaneous and subcuticular tissues and Dermabond Steri-Strips were placed on the skin.Sterile dressing was then applied the patient extubated covered stable condition.

## 2012-02-02 NOTE — Progress Notes (Signed)
Subjective: Patient reports She's feeling great this morning no leg pain pills. She has in the year she's ambulating the distal catheter out  Objective: Vital signs in last 24 hours: Temp:  [97.4 F (36.3 C)-98 F (36.7 C)] 97.8 F (36.6 C) (05/25 0600) Pulse Rate:  [85-101] 85  (05/25 0600) Resp:  [14-18] 18  (05/25 0600) BP: (100-148)/(37-80) 109/63 mmHg (05/25 0600) SpO2:  [93 %-98 %] 95 % (05/25 0600)  Intake/Output from previous day: 05/24 0701 - 05/25 0700 In: 2350 [I.V.:1200; Blood:100; IV Piggyback:1000] Out: 3710 [Urine:3310; Drains:50; Blood:350] Intake/Output this shift:    Strength 5 out of 5 wound is clean and dry  Lab Results:  [facility] 02/01/12 0913  WBC 7.4  HGB 14.1  HCT 42.1  PLT 271   BMET  Basename 02/01/12 0913  NA 139  K 4.1  CL 105  CO2 23  GLUCOSE 117*  BUN 13  CREATININE 0.85  CALCIUM 9.7    Studies/Results: Dg Lumbar Spine 2-3 Views  02/01/2012  *RADIOLOGY REPORT*  Clinical Data: Lumbar disc disease.  LUMBAR SPINE - 2-3 VIEW  Comparison: None.  Findings: Eight and lateral C-arm images demonstrate the patient has undergone posterior fusion at L4-5 and L5-S1 and interbody fusion at L5-S1.  Pedicle screws and posterior rods and fusion device appear in good position on the available images.  IMPRESSION: Fusions performed at L4-5 and L5-S1.  Original Report Authenticated By: Gwynn Burly, M.D.    Assessment/Plan: Posterior day 1 from plus L4-5 L5-S1 and knee immobilizer day possible discharge tomorrow  LOS: 1 day     Crosby Oriordan P 02/02/2012, 9:10 AM

## 2012-02-03 NOTE — Progress Notes (Signed)
Cm notified of MD order for RW. [facility] notified of DME referral. DME scheduled delivery to room prior to d/c. No other HH needs stated. No HH recommendations.    Leonie Green [phone]

## 2012-02-03 NOTE — Progress Notes (Signed)
Patient ID: Courtney Cherry, female   DOB: 1945-11-13, 66 y.o.   MRN: [id number] Patient does very well she's angling and voiding spontaneous he pain is well controlled on pills she is rated be discharged home. She just scheduled followup approximately 1-2 weeks.Kratzer she's been instructed to keep her incision clean and dry.

## 2012-02-03 NOTE — Discharge Summary (Signed)
  Physician Discharge Summary  Patient ID: Courtney Cherry MRN: [id number] DOB/AGE: 1946/01/09 66 y.o.  Admit date: 02/01/2012 Discharge date: 02/03/2012  Admission Diagnoses: Lumbar spinal stenosis and spondylosis  Discharge Diagnoses: Same Active Problems:  * No active hospital problems. *    Discharged Condition: good  Hospital Course: Patient was admitted to the hospital underwent a lumbar fusion postoperatively patient did very well with recovered in the floor on the floor patient was convalescing well and living and voiding spontaneously tolerating regular diet and her pain was well-controlled on oral analgesics. Patient be discharged home scheduled followup in one to 2 weeks.  Consults: Significant Diagnostic Studies: Treatments: Posterior Lumbar interbody fusion Discharge Exam: Blood pressure 127/76, pulse 84, temperature 98.2 F (36.8 C), temperature source Oral, resp. rate 18, height 5\' 5"  (1.651 m), weight 110.224 kg (243 lb), SpO2 98.00%. Strength out of 5 wound clean and dry  Disposition: Home   Medication List  As of 02/03/2012  8:13 AM   TAKE these medications         acetaminophen 500 MG tablet   Commonly known as: TYLENOL   Take 1,000 mg by mouth every 4 (four) hours as needed. For pain      CALCIUM-MAGNESIUM-VITAMIN D PO   Take 1 tablet by mouth 2 (two) times daily.      COENZYME Q-10 PO   Take 1 tablet by mouth daily.      etodolac 500 MG tablet   Commonly known as: LODINE   Take 500 mg by mouth 2 (two) times daily.      fish oil-omega-3 fatty acids 1000 MG capsule   Take 1 g by mouth daily.      GLUCOSAMINE CHONDR COMPLEX PO   Take 5 tablets by mouth daily.      GREEN COFFEE BEAN PO   Take 1 tablet by mouth 2 (two) times daily before a meal.      HYDROcodone-acetaminophen 5-500 MG per tablet   Commonly known as: VICODIN   Take 1 tablet by mouth every 4 (four) hours as needed. For pain      OVER THE COUNTER MEDICATION   Take 1 tablet by  mouth daily. "Solar #7" herbal pain medication      TIGER BALM PAIN RELIEVING 80-24-16 MG Ptch   Generic drug: Camphor-Menthol-Capsicum   Apply 1 patch topically every morning.      traMADol 50 MG tablet   Commonly known as: ULTRAM   Take 100 mg by mouth at bedtime as needed. For pain             Signed: Onesti Bonfiglio P 02/03/2012, 8:13 AM

## 2012-02-03 NOTE — Discharge Instructions (Signed)
No lifting no bending no twisting no driving a riding Carlos come back for see Dr. Gerlene Fee

## 2012-02-07 MED FILL — Sodium Chloride Irrigation Soln 0.9%: Qty: 3000 | Status: AC

## 2012-02-07 MED FILL — Heparin Sodium (Porcine) Inj 1000 Unit/ML: INTRAMUSCULAR | Qty: 30 | Status: AC

## 2012-02-07 MED FILL — Sodium Chloride IV Soln 0.9%: INTRAVENOUS | Qty: 1000 | Status: AC

## 2012-05-19 ENCOUNTER — Ambulatory Visit: Payer: Self-pay

## 2013-05-20 ENCOUNTER — Ambulatory Visit: Payer: Self-pay

## 2013-08-26 ENCOUNTER — Ambulatory Visit: Payer: Self-pay | Admitting: Physical Medicine and Rehabilitation

## 2014-04-02 DIAGNOSIS — E559 Vitamin D deficiency, unspecified: Secondary | ICD-10-CM | POA: Insufficient documentation

## 2014-04-02 DIAGNOSIS — R7303 Prediabetes: Secondary | ICD-10-CM | POA: Insufficient documentation

## 2014-04-02 DIAGNOSIS — M545 Low back pain: Secondary | ICD-10-CM

## 2014-04-02 DIAGNOSIS — G8929 Other chronic pain: Secondary | ICD-10-CM | POA: Insufficient documentation

## 2014-05-11 DIAGNOSIS — M1712 Unilateral primary osteoarthritis, left knee: Secondary | ICD-10-CM | POA: Insufficient documentation

## 2014-05-11 DIAGNOSIS — E119 Type 2 diabetes mellitus without complications: Secondary | ICD-10-CM | POA: Insufficient documentation

## 2014-06-11 ENCOUNTER — Emergency Department: Payer: Self-pay | Admitting: Internal Medicine

## 2014-06-11 LAB — CBC WITH DIFFERENTIAL/PLATELET
BASOS PCT: 1.3 %
Basophil #: 0.1 10*3/uL (ref 0.0–0.1)
EOS PCT: 4.4 %
Eosinophil #: 0.3 10*3/uL (ref 0.0–0.7)
HCT: 41.4 % (ref 35.0–47.0)
HGB: 13.1 g/dL (ref 12.0–16.0)
LYMPHS PCT: 33.2 %
Lymphocyte #: 2.6 10*3/uL (ref 1.0–3.6)
MCH: 28.3 pg (ref 26.0–34.0)
MCHC: 31.7 g/dL — AB (ref 32.0–36.0)
MCV: 90 fL (ref 80–100)
MONO ABS: 0.6 x10 3/mm (ref 0.2–0.9)
MONOS PCT: 7.5 %
NEUTROS PCT: 53.6 %
Neutrophil #: 4.2 10*3/uL (ref 1.4–6.5)
PLATELETS: 247 10*3/uL (ref 150–440)
RBC: 4.63 10*6/uL (ref 3.80–5.20)
RDW: 14.1 % (ref 11.5–14.5)
WBC: 7.8 10*3/uL (ref 3.6–11.0)

## 2014-06-11 LAB — TROPONIN I
Troponin-I: <0.02 ng/mL
Troponin-I: <0.02 ng/mL

## 2014-06-11 LAB — COMPREHENSIVE METABOLIC PANEL
ALBUMIN: 3.4 g/dL (ref 3.4–5.0)
ALK PHOS: 68 U/L
ALT: 34 U/L
ANION GAP: 5 — AB (ref 7–16)
BUN: 16 mg/dL (ref 7–18)
Bilirubin,Total: 0.4 mg/dL (ref 0.2–1.0)
CALCIUM: 9 mg/dL (ref 8.5–10.1)
CHLORIDE: 109 mmol/L — AB (ref 98–107)
Co2: 27 mmol/L (ref 21–32)
Creatinine: 1.05 mg/dL (ref 0.60–1.30)
EGFR (African American): >60
GFR CALC NON AF AMER: 55 — AB
GLUCOSE: 97 mg/dL (ref 65–99)
OSMOLALITY: 282 (ref 275–301)
POTASSIUM: 4.3 mmol/L (ref 3.5–5.1)
SGOT(AST): 37 U/L (ref 15–37)
Sodium: 141 mmol/L (ref 136–145)
Total Protein: 7.4 g/dL (ref 6.4–8.2)

## 2014-06-11 LAB — PRO B NATRIURETIC PEPTIDE: B-Type Natriuretic Peptide: 260 pg/mL — ABNORMAL HIGH (ref 0–125)

## 2014-06-11 LAB — D-DIMER(ARMC): D-Dimer: 1416 ng/ml

## 2014-06-15 DIAGNOSIS — R911 Solitary pulmonary nodule: Secondary | ICD-10-CM | POA: Insufficient documentation

## 2014-07-02 ENCOUNTER — Ambulatory Visit: Payer: Self-pay | Admitting: Internal Medicine

## 2014-11-15 ENCOUNTER — Ambulatory Visit: Payer: Self-pay | Admitting: Internal Medicine

## 2014-11-24 ENCOUNTER — Ambulatory Visit: Payer: Self-pay | Admitting: General Practice

## 2014-12-08 ENCOUNTER — Inpatient Hospital Stay
Admit: 2014-12-08 | Disposition: A | Discharge status: Home / Self Care | Payer: Self-pay | Attending: General Practice | Admitting: General Practice

## 2014-12-08 HISTORY — PX: JOINT REPLACEMENT: SHX530

## 2014-12-09 LAB — BASIC METABOLIC PANEL
ANION GAP: 4 — AB (ref 7–16)
BUN: 13 mg/dL
CO2: 27 mmol/L
CREATININE: 0.97 mg/dL
Calcium, Total: 8.1 mg/dL — ABNORMAL LOW
Chloride: 105 mmol/L
GFR CALC NON AF AMER: 60 — AB
GLUCOSE: 131 mg/dL — AB
POTASSIUM: 4.1 mmol/L
Sodium: 136 mmol/L

## 2014-12-09 LAB — HEMOGLOBIN: HGB: 11.7 g/dL — ABNORMAL LOW (ref 12.0–16.0)

## 2014-12-09 LAB — PLATELET COUNT: PLATELETS: 177 10*3/uL (ref 150–440)

## 2014-12-10 ENCOUNTER — Encounter
Admit: 2014-12-10 | Disposition: A | Discharge status: Home / Self Care | Payer: Self-pay | Attending: Internal Medicine | Admitting: Internal Medicine

## 2014-12-10 LAB — BASIC METABOLIC PANEL
ANION GAP: 5 — AB (ref 7–16)
BUN: 8 mg/dL
CALCIUM: 8.4 mg/dL — AB
CHLORIDE: 103 mmol/L
Co2: 28 mmol/L
Creatinine: 0.84 mg/dL
EGFR (Non-African Amer.): >60
GLUCOSE: 115 mg/dL — AB
Potassium: 4 mmol/L
Sodium: 136 mmol/L

## 2014-12-10 LAB — PLATELET COUNT: PLATELETS: 188 10*3/uL (ref 150–440)

## 2014-12-10 LAB — HEMOGLOBIN: HGB: 11.8 g/dL — AB (ref 12.0–16.0)

## 2014-12-22 DIAGNOSIS — Z96652 Presence of left artificial knee joint: Secondary | ICD-10-CM | POA: Insufficient documentation

## 2015-01-09 NOTE — Discharge Summary (Signed)
PATIENT NAME:  Courtney Cherry, Courtney Cherry MR#:  [id number] DATE OF BIRTH:  06-11-1946  DATE OF ADMISSION:  12/08/2014 DATE OF DISCHARGE:  12/11/2014   ADMITTING DIAGNOSIS: Degenerative arthrosis of the left knee.   DISCHARGE DIAGNOSIS: Degenerative arthrosis of. the left knee.  HISTORY OF PRESENT ILLNESS: The patient is a very pleasant 69 year old female who has been followed at [facility] for progression of left knee pain. The patient has a long history of progressive left knee pain. Her pain was aggravated with weight-bearing activities. She was also experiencing some night pain. She did report some swelling of the left knee, as well as near giving way. She had localized most of the pain along both the medial and lateral joint lines. She had also appreciated some progressive decrease left knee range of motion. She had been treated in the past with Synvisc injections, as well as intra-articular cortisone injections, anti-inflammatory medications, and activity modification with no significant improvement in her condition. The patient states that her pain had progressed to the point that it was significantly interfering with her activities of daily living. X-rays taken in [place] showed narrowing of the medial, as well as lateral compartment space, as well as being associated with varus alignment. There was osteophyte as well as subchondral sclerosis. After discussion of the risks and benefits of surgical intervention, the patient expressed her understanding of the risks and benefits and agreed for plans for surgical intervention.   HOSPITAL COURSE:   PROCEDURE: Left total knee arthroplasty using computer-assisted navigation.   ANESTHESIA: Spinal.   SOFT TISSUE RELEASE: Anterior cruciate ligament, posterior cruciate ligament, deep medial collateral ligaments, as well as the patellofemoral ligament.   IMPLANTS UTILIZED: DePuy PFC Sigma size 3 posterior stabilized femoral component  (cemented), size 3 MBT tibial component (cemented), a 38 mm 3-pegged oval dome patella (cemented), and a 12.5 mm stabilized rotating platform polyethylene insert.   The patient tolerated the procedure very well. She had no complications. She was then taken to the PAC-U where she was stabilized, then transferred to the orthopedic floor. She began receiving anticoagulation therapy of Lovenox 30 mg subcutaneous q. 12 hours. She was also fitted with the AV-I compression foot pumps bilaterally set at 80 mmHg. The patient was fitted with TED stockings, bilaterally. These were allowed to be removed 1 hour per 8-hour shift. The left one was applied on day 2, following removal of the Hemovac. The patient's heels were elevated off the bed. There has been no evidence of any DVTs. Negative Homans sign.   The patient has denied any chest pain or shortness of breath. Vital signs have been stable. She has been afebrile. Hemodynamically, she was stable and no transfusions were given, other than the Autovac transfusion given the first 6 hours, postoperatively.   Physical therapy was initiated on day 1 for gait training and transfers. She has done very well. Upon being discharged, was ambulating greater than 200 feet. She was able go up 4 steps. She was independent with bed to chair transfers. Occupational therapy was also initiated on day 1 for activities of daily living and assistive devices.   The patient's IV and Foley were discontinued on day 2. Dressing changed on day 3. Polar Care was reapplied to the surgical leg, maintaining a temperature of 40 to 50 degrees Fahrenheit.   DISPOSITION: The patient is being discharged to home in improved stable condition.   DISCHARGE INSTRUCTIONS: She may continue weight-bearing as tolerated. Continue using a walker until cleared by  physical therapy to go to a quad cane. She will receive home health PT. She was instructed on continuing the use of TED stockings. These are to be worn  during the day, but may be removed at night. Continue Polar Care maintaining a temperature of 40 to 50 degrees Fahrenheit. I have recommend that she use this around-the-clock for the first 2 weeks. She was instructed on elevation of the lower extremities. She is placed on a regular diet. She has a follow-up appointment in [facility] on April 14 at 8:45. She is to call the clinic sooner if any temperatures of 101.5 or greater or excessive bleeding.   DISCHARGE MEDICATIONS: The patient may resume her regular medication that she was on prior to admission. She was given prescription for Lovenox 40 mg subcutaneously daily for 14 days, then discontinue and begin taking one 81 mg enteric-coated aspirin. She has also given a prescription for Roxicodone 5 mg 1-2 tablets q. 4-6 hours p.r.n. for pain and tramadol 50 mg q. 4-6 hours p.r.n. for pain.   PAST MEDICAL HISTORY: Eczema, obesity, venous stasis, elevated sedimentation rate, shingles, osteopenia.    ____________________________ Vance Peper, PA jrw:mw D: 12/11/2014 08:49:01 ET T: 12/11/2014 08:58:58 ET JOB#: [id number]  cc: Vance Peper, PA, <Dictator> JON WOLFE PA ELECTRONICALLY SIGNED 12/11/2014 20:21

## 2015-01-09 NOTE — Op Note (Signed)
PATIENT NAME:  Courtney Cherry, Courtney Cherry MR#:  [id number] DATE OF BIRTH:  05/06/1946  DATE OF PROCEDURE:  12/08/2014  PREOPERATIVE DIAGNOSIS: Degenerative arthrosis of the left knee.   POSTOPERATIVE DIAGNOSIS: Degenerative arthrosis of the left knee (primary).   PROCEDURE PERFORMED: Left total knee arthroplasty using computer-assisted navigation.   SURGEON: Skip Estimable, M.D.   ASSISTANT: Vance Peper, PA (required to maintain retraction throughout the procedure).   ANESTHESIA: Spinal.   ESTIMATED BLOOD LOSS: 100 mL.   FLUIDS REPLACED: 1350 mL.  TOURNIQUET TIME: 96 minutes.   DRAINS: Two medium drains to reinfusion system.   SOFT TISSUE RELEASES:  Anterior cruciate ligament, posterior cruciate ligament, deep medial collateral ligament, and patellofemoral ligament.   IMPLANTS UTILIZED:  DePuy PFC Sigma size 3 posterior stabilized femoral component (cemented), size 3 MBT tibial component (cemented), a 38 mm 3 peg oval dome patella (cemented), and a 12.5 mm stabilized rotating platform polyethylene insert.   INDICATIONS FOR SURGERY:  The patient is a 69 year old female who has been seen for complaints of severe progressive left knee pain. X-rays demonstrated severe degenerative changes in a tricompartmental fashion with relative varus alignment. After discussion of the risks and benefits of surgical intervention, the patient expressed understanding of the risks and benefits, and agreed with plans for surgical intervention.   PROCEDURE IN DETAIL: The patient was brought into the operating room and, after adequate spinal anesthesia was achieved, a tourniquet was placed on the patient's upper left thigh. The patient's left knee and leg were cleaned and prepped with alcohol and DuraPrep and draped in the usual sterile fashion. A "timeout" was performed as per usual protocol. The left lower extremity was exsanguinated using an Esmarch, and the tourniquet was inflated to 300 mmHg.  An anterior longitudinal  incision was made followed by a standard mid vastus approach. A moderate effusion was evacuated. The deep fibers of the medial collateral ligament were elevated in a subperiosteal fashion off of the medial flare of the tibia so as to maintain a continuous soft tissue sleeve. The patella was subluxed laterally and the patellofemoral ligament was incised. Inspection of the knee demonstrated severe degenerative changes in a tricompartmental fashion. Prominent osteophytes were debrided using rongeurs. Anterior and posterior cruciate ligaments were excised. Two 4.0 mm Schanz pins were inserted into the femur and into the tibia for attachment of the ray of trackers used for computer-assisted navigation. Hip center was identified using circumduction technique. Distal landmarks were mapped using the computer. The distal femur and proximal tibia were mapped using the computer. Distal femoral cutting guide was positioned using computer-assisted navigation so as to achieve a 5 degree distal valgus cut. Cut was performed and verified using the computer. Distal femur was sized and it was felt that a size 3 femoral component was appropriate. A size 3 cutting guide was positioned and the anterior cut was performed and verified using the computer. This was followed by completion of the posterior and chamfer cuts. Femoral cutting guide for the central box was then positioned and the central box cut was performed. Attention was then directed to the proximal tibia. Medial and lateral menisci were excised. The extramedullary tibial cutting guide was positioned using computer-assisted navigation so as to achieve a 0 degree varus valgus alignment and a 0 degree posterior slope. Cut was performed and verified using the computer. The proximal tibia was sized and it was felt that a size 3 tibial tray was appropriate. Tibial and femoral trials were inserted followed by insertion of a  10 mm polyethylene insert and subsequently with a 12.5 mm  polyethylene trial. This allowed for excellent mediolateral soft tissue balancing both in full extension and in flexion. Finally, the patella was cut and prepared so as to accommodate a 38 mm 3 peg oval dome patella.  Patellar trial was placed and the knee was placed through a range of motion with excellent patellar tracking appreciated. The femoral trial was removed after debridement of posterior osteophytes. The central post hole for the tibial component was reamed by insertion of a keel punch. Tibial trials were then removed. The cut surfaces of bone were irrigated with copious amounts of normal saline with antibiotic solution using pulsatile lavage then suctioned dry. Polymethyl methacrylate cement with gentamicin was prepared in the usual fashion using a vacuum mixer. Cement was applied to the cut surface of the proximal tibia as well as along the undersurface of a size 3 MTB tibial component. The tibial component was positioned and impacted into place. Excess cement was removed using [employer] fast streamer. Cement was then applied to the cut surface of the femur as well as on the posterior flanges of a size 3 posterior stabilized femoral component. Femoral component was positioned and impacted into place. Excess cement was removed using [employer] fast streamer. A 12.5 mm polyethylene trial was inserted and the knee was brought into full extension with steady axial compression applied. Finally, cement was applied to the backside of a 38 mm 3 peg oval dome patella and the patella component was positioned and patellar clamp applied. Excess cement was removed using [employer] fast streamer.   After adequate curing of cement, the tourniquet was deflated after a total tourniquet time of 96 minutes. Hemostasis was achieved using electrocautery. The knee was irrigated with copious amounts of normal saline with antibiotic solution using pulsatile lavage and then suctioned dry. The knee was inspected for any residual cement debris. Twenty mL  of 1.3% Exparel in 40 mL of normal saline was injected along the posterior capsule, medial and lateral gutters, and along the arthrotomy site. A 12.5 mm stabilized rotating platform polyethylene insert was inserted and the knee was placed through a range of motion with excellent patellar tracking appreciated and excellent mediolateral soft tissue balancing noted both in full extension and in flexion. Two medium drains were placed in the wound bed and brought out through a separate stab incision to be attached to a reinfusion system. The medial parapatellar portion of the incision was reapproximated using interrupted sutures of #1 Vicryl. The subcutaneous tissue was injected with 30 mL of 0.25% Marcaine with epinephrine. The subcutaneous tissue was then reapproximated in layers using first #0 Vicryl followed by #2-0 Vicryl. Skin was closed with skin staples. A sterile dressing was applied. The patient tolerated the procedure well. She was transported to the recovery room in stable condition.   ____________________________ Laurice Record. Holley Bouche., MD jph:sp D: 12/09/2014 02:38:40 ET T: 12/09/2014 08:56:50 ET JOB#: [id number]  cc: Laurice Record. Holley Bouche., MD, <Dictator> JAMES P Holley Bouche MD ELECTRONICALLY SIGNED 12/18/2014 10:02

## 2015-11-28 DIAGNOSIS — E119 Type 2 diabetes mellitus without complications: Secondary | ICD-10-CM | POA: Diagnosis not present

## 2015-11-28 DIAGNOSIS — Z8639 Personal history of other endocrine, nutritional and metabolic disease: Secondary | ICD-10-CM | POA: Diagnosis not present

## 2015-12-05 ENCOUNTER — Other Ambulatory Visit: Payer: Self-pay | Admitting: Internal Medicine

## 2015-12-05 DIAGNOSIS — E870 Hyperosmolality and hypernatremia: Secondary | ICD-10-CM | POA: Diagnosis not present

## 2015-12-05 DIAGNOSIS — Z1239 Encounter for other screening for malignant neoplasm of breast: Secondary | ICD-10-CM

## 2015-12-05 DIAGNOSIS — Z8639 Personal history of other endocrine, nutritional and metabolic disease: Secondary | ICD-10-CM | POA: Insufficient documentation

## 2015-12-05 DIAGNOSIS — Z23 Encounter for immunization: Secondary | ICD-10-CM | POA: Diagnosis not present

## 2015-12-05 DIAGNOSIS — N183 Chronic kidney disease, stage 3 unspecified: Secondary | ICD-10-CM | POA: Insufficient documentation

## 2015-12-05 DIAGNOSIS — Z0000 Encounter for general adult medical examination without abnormal findings: Secondary | ICD-10-CM | POA: Diagnosis not present

## 2015-12-05 DIAGNOSIS — M5136 Other intervertebral disc degeneration, lumbar region: Secondary | ICD-10-CM | POA: Insufficient documentation

## 2015-12-05 DIAGNOSIS — M8580 Other specified disorders of bone density and structure, unspecified site: Secondary | ICD-10-CM | POA: Insufficient documentation

## 2015-12-15 DIAGNOSIS — M8580 Other specified disorders of bone density and structure, unspecified site: Secondary | ICD-10-CM | POA: Diagnosis not present

## 2015-12-21 ENCOUNTER — Ambulatory Visit
Admission: RE | Admit: 2015-12-21 | Discharge: 2015-12-21 | Disposition: A | Discharge status: Home / Self Care | Payer: PPO | Source: Ambulatory Visit | Attending: Internal Medicine | Admitting: Internal Medicine

## 2015-12-21 DIAGNOSIS — Z1231 Encounter for screening mammogram for malignant neoplasm of breast: Secondary | ICD-10-CM | POA: Insufficient documentation

## 2015-12-21 DIAGNOSIS — Z1239 Encounter for other screening for malignant neoplasm of breast: Secondary | ICD-10-CM

## 2016-02-28 DIAGNOSIS — N183 Chronic kidney disease, stage 3 (moderate): Secondary | ICD-10-CM | POA: Diagnosis not present

## 2016-03-19 DIAGNOSIS — N183 Chronic kidney disease, stage 3 (moderate): Secondary | ICD-10-CM | POA: Diagnosis not present

## 2016-03-19 DIAGNOSIS — M5136 Other intervertebral disc degeneration, lumbar region: Secondary | ICD-10-CM | POA: Diagnosis not present

## 2016-03-19 DIAGNOSIS — R7303 Prediabetes: Secondary | ICD-10-CM | POA: Diagnosis not present

## 2016-03-19 DIAGNOSIS — M8589 Other specified disorders of bone density and structure, multiple sites: Secondary | ICD-10-CM | POA: Diagnosis not present

## 2016-04-03 DIAGNOSIS — Z981 Arthrodesis status: Secondary | ICD-10-CM | POA: Diagnosis not present

## 2016-04-03 DIAGNOSIS — M4327 Fusion of spine, lumbosacral region: Secondary | ICD-10-CM | POA: Diagnosis not present

## 2016-04-03 DIAGNOSIS — M545 Low back pain: Secondary | ICD-10-CM | POA: Diagnosis not present

## 2016-04-06 ENCOUNTER — Other Ambulatory Visit: Payer: Self-pay | Admitting: Orthopedic Surgery

## 2016-04-06 DIAGNOSIS — M4327 Fusion of spine, lumbosacral region: Secondary | ICD-10-CM

## 2016-04-11 ENCOUNTER — Ambulatory Visit
Admission: RE | Admit: 2016-04-11 | Discharge: 2016-04-11 | Disposition: A | Discharge status: Home / Self Care | Payer: PPO | Source: Ambulatory Visit | Attending: Orthopedic Surgery | Admitting: Orthopedic Surgery

## 2016-04-11 DIAGNOSIS — M4806 Spinal stenosis, lumbar region: Secondary | ICD-10-CM | POA: Insufficient documentation

## 2016-04-11 DIAGNOSIS — M4327 Fusion of spine, lumbosacral region: Secondary | ICD-10-CM | POA: Diagnosis not present

## 2016-04-11 DIAGNOSIS — M47896 Other spondylosis, lumbar region: Secondary | ICD-10-CM | POA: Insufficient documentation

## 2016-04-11 DIAGNOSIS — M5126 Other intervertebral disc displacement, lumbar region: Secondary | ICD-10-CM | POA: Diagnosis not present

## 2016-04-17 DIAGNOSIS — M5136 Other intervertebral disc degeneration, lumbar region: Secondary | ICD-10-CM | POA: Diagnosis not present

## 2016-04-17 DIAGNOSIS — M4327 Fusion of spine, lumbosacral region: Secondary | ICD-10-CM | POA: Diagnosis not present

## 2016-04-17 DIAGNOSIS — G8929 Other chronic pain: Secondary | ICD-10-CM | POA: Diagnosis not present

## 2016-04-17 DIAGNOSIS — M545 Low back pain: Secondary | ICD-10-CM | POA: Diagnosis not present

## 2016-04-18 DIAGNOSIS — M545 Low back pain: Secondary | ICD-10-CM | POA: Diagnosis not present

## 2016-04-21 DIAGNOSIS — N390 Urinary tract infection, site not specified: Secondary | ICD-10-CM | POA: Diagnosis not present

## 2016-04-21 DIAGNOSIS — R399 Unspecified symptoms and signs involving the genitourinary system: Secondary | ICD-10-CM | POA: Diagnosis not present

## 2016-05-23 DIAGNOSIS — M47817 Spondylosis without myelopathy or radiculopathy, lumbosacral region: Secondary | ICD-10-CM | POA: Diagnosis not present

## 2016-06-07 DIAGNOSIS — E119 Type 2 diabetes mellitus without complications: Secondary | ICD-10-CM | POA: Diagnosis not present

## 2016-06-07 DIAGNOSIS — R197 Diarrhea, unspecified: Secondary | ICD-10-CM | POA: Diagnosis not present

## 2016-06-07 DIAGNOSIS — R112 Nausea with vomiting, unspecified: Secondary | ICD-10-CM | POA: Diagnosis not present

## 2016-06-07 DIAGNOSIS — N183 Chronic kidney disease, stage 3 (moderate): Secondary | ICD-10-CM | POA: Diagnosis not present

## 2016-06-07 DIAGNOSIS — R1084 Generalized abdominal pain: Secondary | ICD-10-CM | POA: Diagnosis not present

## 2016-06-08 DIAGNOSIS — R197 Diarrhea, unspecified: Secondary | ICD-10-CM | POA: Diagnosis not present

## 2016-06-12 DIAGNOSIS — Z8719 Personal history of other diseases of the digestive system: Secondary | ICD-10-CM | POA: Diagnosis not present

## 2016-06-12 DIAGNOSIS — Z8371 Family history of colonic polyps: Secondary | ICD-10-CM | POA: Diagnosis not present

## 2016-06-12 DIAGNOSIS — A0472 Enterocolitis due to Clostridium difficile, not specified as recurrent: Secondary | ICD-10-CM | POA: Diagnosis not present

## 2016-06-12 DIAGNOSIS — Z8619 Personal history of other infectious and parasitic diseases: Secondary | ICD-10-CM | POA: Insufficient documentation

## 2016-06-12 DIAGNOSIS — R197 Diarrhea, unspecified: Secondary | ICD-10-CM | POA: Diagnosis not present

## 2016-06-19 DIAGNOSIS — R7303 Prediabetes: Secondary | ICD-10-CM | POA: Diagnosis not present

## 2016-06-19 DIAGNOSIS — N183 Chronic kidney disease, stage 3 (moderate): Secondary | ICD-10-CM | POA: Diagnosis not present

## 2016-06-19 DIAGNOSIS — M5136 Other intervertebral disc degeneration, lumbar region: Secondary | ICD-10-CM | POA: Diagnosis not present

## 2016-06-19 DIAGNOSIS — M8589 Other specified disorders of bone density and structure, multiple sites: Secondary | ICD-10-CM | POA: Diagnosis not present

## 2016-06-26 DIAGNOSIS — G8929 Other chronic pain: Secondary | ICD-10-CM | POA: Diagnosis not present

## 2016-06-26 DIAGNOSIS — M545 Low back pain: Secondary | ICD-10-CM | POA: Diagnosis not present

## 2016-06-26 DIAGNOSIS — M5136 Other intervertebral disc degeneration, lumbar region: Secondary | ICD-10-CM | POA: Diagnosis not present

## 2016-06-26 DIAGNOSIS — M4327 Fusion of spine, lumbosacral region: Secondary | ICD-10-CM | POA: Diagnosis not present

## 2016-06-26 DIAGNOSIS — M47817 Spondylosis without myelopathy or radiculopathy, lumbosacral region: Secondary | ICD-10-CM | POA: Diagnosis not present

## 2016-08-13 DIAGNOSIS — J019 Acute sinusitis, unspecified: Secondary | ICD-10-CM | POA: Diagnosis not present

## 2016-08-13 DIAGNOSIS — B9689 Other specified bacterial agents as the cause of diseases classified elsewhere: Secondary | ICD-10-CM | POA: Diagnosis not present

## 2016-08-31 ENCOUNTER — Encounter: Payer: Self-pay | Admitting: *Deleted

## 2016-09-05 ENCOUNTER — Ambulatory Visit
Admission: RE | Admit: 2016-09-05 | Discharge: 2016-09-05 | Disposition: A | Discharge status: Home / Self Care | Payer: PPO | Source: Ambulatory Visit | Attending: Unknown Physician Specialty | Admitting: Unknown Physician Specialty

## 2016-09-05 ENCOUNTER — Ambulatory Visit: Payer: PPO | Admitting: Certified Registered Nurse Anesthetist

## 2016-09-05 ENCOUNTER — Encounter: Payer: Self-pay | Admitting: Certified Registered Nurse Anesthetist

## 2016-09-05 ENCOUNTER — Encounter
Admission: RE | Disposition: A | Discharge status: Home / Self Care | Payer: Self-pay | Source: Ambulatory Visit | Attending: Unknown Physician Specialty

## 2016-09-05 DIAGNOSIS — M797 Fibromyalgia: Secondary | ICD-10-CM | POA: Diagnosis not present

## 2016-09-05 DIAGNOSIS — Z882 Allergy status to sulfonamides status: Secondary | ICD-10-CM | POA: Insufficient documentation

## 2016-09-05 DIAGNOSIS — F329 Major depressive disorder, single episode, unspecified: Secondary | ICD-10-CM | POA: Insufficient documentation

## 2016-09-05 DIAGNOSIS — K5730 Diverticulosis of large intestine without perforation or abscess without bleeding: Secondary | ICD-10-CM | POA: Diagnosis not present

## 2016-09-05 DIAGNOSIS — Z6841 Body Mass Index (BMI) 40.0 and over, adult: Secondary | ICD-10-CM | POA: Insufficient documentation

## 2016-09-05 DIAGNOSIS — Z1211 Encounter for screening for malignant neoplasm of colon: Secondary | ICD-10-CM | POA: Insufficient documentation

## 2016-09-05 DIAGNOSIS — D649 Anemia, unspecified: Secondary | ICD-10-CM | POA: Diagnosis not present

## 2016-09-05 DIAGNOSIS — K640 First degree hemorrhoids: Secondary | ICD-10-CM | POA: Insufficient documentation

## 2016-09-05 DIAGNOSIS — M8580 Other specified disorders of bone density and structure, unspecified site: Secondary | ICD-10-CM | POA: Diagnosis not present

## 2016-09-05 DIAGNOSIS — Z888 Allergy status to other drugs, medicaments and biological substances status: Secondary | ICD-10-CM | POA: Insufficient documentation

## 2016-09-05 DIAGNOSIS — Z8371 Family history of colonic polyps: Secondary | ICD-10-CM | POA: Diagnosis not present

## 2016-09-05 DIAGNOSIS — D120 Benign neoplasm of cecum: Secondary | ICD-10-CM | POA: Insufficient documentation

## 2016-09-05 DIAGNOSIS — I878 Other specified disorders of veins: Secondary | ICD-10-CM | POA: Insufficient documentation

## 2016-09-05 DIAGNOSIS — G8929 Other chronic pain: Secondary | ICD-10-CM | POA: Diagnosis not present

## 2016-09-05 DIAGNOSIS — M545 Low back pain: Secondary | ICD-10-CM | POA: Insufficient documentation

## 2016-09-05 DIAGNOSIS — K5520 Angiodysplasia of colon without hemorrhage: Secondary | ICD-10-CM | POA: Diagnosis not present

## 2016-09-05 DIAGNOSIS — K635 Polyp of colon: Secondary | ICD-10-CM | POA: Diagnosis not present

## 2016-09-05 DIAGNOSIS — M1990 Unspecified osteoarthritis, unspecified site: Secondary | ICD-10-CM | POA: Insufficient documentation

## 2016-09-05 DIAGNOSIS — E669 Obesity, unspecified: Secondary | ICD-10-CM | POA: Diagnosis not present

## 2016-09-05 HISTORY — PX: COLONOSCOPY WITH PROPOFOL: SHX5780

## 2016-09-05 HISTORY — DX: Other specified disorders of bone density and structure, unspecified site: M85.80

## 2016-09-05 HISTORY — DX: Other specified disorders of veins: I87.8

## 2016-09-05 HISTORY — DX: Dermatitis, unspecified: L30.9

## 2016-09-05 HISTORY — DX: Obesity, unspecified: E66.9

## 2016-09-05 HISTORY — DX: Zoster without complications: B02.9

## 2016-09-05 LAB — GLUCOSE, CAPILLARY: GLUCOSE-CAPILLARY: 103 mg/dL — AB (ref 65–99)

## 2016-09-05 SURGERY — COLONOSCOPY WITH PROPOFOL
Anesthesia: General

## 2016-09-05 MED ORDER — PROPOFOL 500 MG/50ML IV EMUL
INTRAVENOUS | Status: AC
  Filled 2016-09-05: qty 50

## 2016-09-05 MED ORDER — PIPERACILLIN-TAZOBACTAM 3.375 G IVPB 30 MIN
3.3750 g | Freq: Once | INTRAVENOUS | Status: DC
  Filled 2016-09-05: qty 50

## 2016-09-05 MED ORDER — LIDOCAINE 2% (20 MG/ML) 5 ML SYRINGE
INTRAMUSCULAR | Status: AC
  Filled 2016-09-05: qty 5

## 2016-09-05 MED ORDER — LIDOCAINE HCL (CARDIAC) 20 MG/ML IV SOLN
INTRAVENOUS | Status: DC | PRN
  Administered 2016-09-05: 50 mg via INTRAVENOUS

## 2016-09-05 MED ORDER — SODIUM CHLORIDE 0.9 % IJ SOLN
INTRAMUSCULAR | Status: DC | PRN
  Administered 2016-09-05: 20 mL via INTRAVENOUS

## 2016-09-05 MED ORDER — MIDAZOLAM HCL 2 MG/2ML IJ SOLN
INTRAMUSCULAR | Status: AC
  Filled 2016-09-05: qty 2

## 2016-09-05 MED ORDER — SODIUM CHLORIDE 0.9 % IV SOLN
INTRAVENOUS | Status: DC
  Administered 2016-09-05: 1000 mL via INTRAVENOUS

## 2016-09-05 MED ORDER — PROPOFOL 10 MG/ML IV BOLUS
INTRAVENOUS | Status: DC | PRN
  Administered 2016-09-05: 10 mg via INTRAVENOUS
  Administered 2016-09-05: 30 mg via INTRAVENOUS

## 2016-09-05 MED ORDER — SODIUM CHLORIDE 0.9 % IV SOLN: INTRAVENOUS | Status: DC

## 2016-09-05 MED ORDER — PROPOFOL 500 MG/50ML IV EMUL
INTRAVENOUS | Status: DC | PRN
  Administered 2016-09-05: 140 ug/kg/min via INTRAVENOUS

## 2016-09-05 MED ORDER — MIDAZOLAM HCL 2 MG/2ML IJ SOLN
INTRAMUSCULAR | Status: DC | PRN
  Administered 2016-09-05: 1 mg via INTRAVENOUS

## 2016-09-05 NOTE — Transfer of Care (Signed)
Immediate Anesthesia Transfer of Care Note  Patient: Courtney Cherry  Procedure(s) Performed: Procedure(s): COLONOSCOPY WITH PROPOFOL (N/A)  Patient Location: PACU  Anesthesia Type:General  Level of Consciousness: sedated  Airway & Oxygen Therapy: Patient Spontanous Breathing and Patient connected to nasal cannula oxygen  Post-op Assessment: Report given to RN and Post -op Vital signs reviewed and stable  Post vital signs: Reviewed and stable  Last Vitals:  Vitals:   09/05/16 0727 09/05/16 0900  BP: 135/77 (!) 96/59  Pulse: 79 60  Resp: 18 16  Temp: 36.7 C (!) 35.6 C    Last Pain:  Vitals:   09/05/16 0900  TempSrc: Tympanic         Complications: No apparent anesthesia complications

## 2016-09-05 NOTE — H&P (Signed)
Primary Care Physician:  Glendon Axe, MD Primary Gastroenterologist:  Dr. Vira Agar  Pre-Procedure History & Physical: HPI:  Courtney Cherry is a 70 y.o. female is here for an colonoscopy.   Past Medical History:  Diagnosis Date  . Anemia 1951  . Arthritis   . Chronic lower back pain   . Depression   . Eczema   . Fibromyalgia    "went away after I stopped using artificial sweeteners"  . Jaundice    "w/gallstones"  . Obesity   . Osteopenia   . Pneumonia ~ 2002; 1995  . Shingles   . Shortness of breath    exertion due to back  . Urinary urgency   . Venous stasis     Past Surgical History:  Procedure Laterality Date  . ABDOMINAL HYSTERECTOMY  1974  . Lares; 1999   left screws and plate 1997  and removed 1999  . BACK SURGERY     fusion 2008 L4-L5  . BACK SURGERY    . BREAST BIOPSY     left; "it was fine"  . CATARACT EXTRACTION W/ INTRAOCULAR LENS  IMPLANT, BILATERAL  ~ 2009  . CHOLECYSTECTOMY  1966  . EYE SURGERY     3 surgeries left 2010,and 1 right eye 2009  . FRACTURE SURGERY    . JOINT REPLACEMENT Left 12/08/2014  . KNEE ARTHROSCOPY  2006   left   . 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  . MICRODISCECTOMY LUMBAR  02/2011  . NASAL RECONSTRUCTION WITH SEPTAL REPAIR    . POSTERIOR FUSION LUMBAR SPINE  02/01/12  . TONSILLECTOMY  1953    Prior to Admission medications   Medication Sig Start Date End Date Taking? Authorizing Provider  ergocalciferol (VITAMIN D2) 50000 units capsule Take 50,000 Units by mouth daily.   Yes Historical Provider, MD  Lactobacillus (FLORANEX PO) Take by mouth every morning.   Yes Historical Provider, MD  traMADol (ULTRAM) 50 MG tablet Take 100 mg by mouth at bedtime as needed. For pain   Yes Historical Provider, MD  acetaminophen (TYLENOL) 500 MG tablet Take 1,000 mg by mouth every 4 (four) hours as needed. For pain    Historical Provider, MD  CALCIUM-MAGNESIUM-VITAMIN D PO  Take 1 tablet by mouth 2 (two) times daily.    Historical Provider, MD  Camphor-Menthol-Capsicum (TIGER BALM PAIN RELIEVING) 80-24-16 MG PTCH Apply 1 patch topically every morning.    Historical Provider, MD  COENZYME Q-10 PO Take 1 tablet by mouth daily.    Historical Provider, MD  etodolac (LODINE) 500 MG tablet Take 500 mg by mouth 2 (two) times daily.    Historical Provider, MD  fish oil-omega-3 fatty acids 1000 MG capsule Take 1 g by mouth daily.    Historical Provider, MD  Glucosamine-Chondroitin (GLUCOSAMINE CHONDR COMPLEX PO) Take 5 tablets by mouth daily.    Historical Provider, MD  GREEN COFFEE BEAN PO Take 1 tablet by mouth 2 (two) times daily before a meal.    Historical Provider, MD  HYDROcodone-acetaminophen (VICODIN) 5-500 MG per tablet Take 1 tablet by mouth every 4 (four) hours as needed. For pain    Historical Provider, MD  ondansetron (ZOFRAN) 4 MG tablet Take 4 mg by mouth every 8 (eight) hours as needed for nausea or vomiting.    Historical Provider, MD  OVER THE COUNTER MEDICATION Take 1 tablet by mouth daily. "Solar #7" herbal pain medication  Historical Provider, MD    Allergies as of 08/27/2016 - Review Complete 02/01/2012  Allergen Reaction Noted  . Sulfa antibiotics Rash 01/02/2012  . Adhesive [tape] Other (See Comments) 01/31/2012  . Valium [diazepam] Other (See Comments) 01/02/2012    Family History  Problem Relation Age of Onset  . Breast cancer Paternal Grandmother 86    and 55  . Anesthesia problems Neg Hx   . Hypotension Neg Hx   . Malignant hyperthermia Neg Hx   . Pseudochol deficiency Neg Hx     Social History   Social History  . Marital status: Married    Spouse name: N/A  . Number of children: N/A  . Years of education: N/A   Occupational History  . Not on file.   Social History Main Topics  . Smoking status: Never Smoker  . Smokeless tobacco: Never Used  . Alcohol use No  . Drug use: No  . Sexual activity: Not Currently   Other  Topics Concern  . Not on file   Social History Narrative  . No narrative on file    Review of Systems: See HPI, otherwise negative ROS  Physical Exam: BP 135/77   Pulse 79   Temp 98 F (36.7 C) (Tympanic)   Resp 18   Ht 5' 3.5" (1.613 m)   Wt 105.2 kg (232 lb)   SpO2 97%   BMI 40.45 kg/m  General:   Alert,  pleasant and cooperative in NAD Head:  Normocephalic and atraumatic. Neck:  Supple; no masses or thyromegaly. Lungs:  Clear throughout to auscultation.    Heart:  Regular rate and rhythm. Abdomen:  Soft, nontender and nondistended. Normal bowel sounds, without guarding, and without rebound.   Neurologic:  Alert and  oriented x4;  grossly normal neurologically.  Impression/Plan: Courtney Cherry is here for an colonoscopy to be performed for [facility] in [place]  Risks, benefits, limitations, and alternatives regarding  colonoscopy have been reviewed with the patient.  Questions have been answered.  All parties agreeable.   Gaylyn Cheers, MD  09/05/2016, 8:12 AM

## 2016-09-05 NOTE — Op Note (Signed)
[facility] Gastroenterology Patient Name: Courtney Cherry Procedure Date: 09/05/2016 8:14 AM MRN: [id number] Account #: [id number] Date of Birth: [birth year] Admit Type: Outpatient Age: 70 Room: [facility] ENDO ROOM 4 Gender: Female Note Status: Finalized Procedure:            Colonoscopy Indications:          Colon cancer screening in patient at increased risk:                        Family history of 1st-degree relative with colon polyps Providers:            Manya Silvas, MD Referring MD:         Glendon Axe (Referring MD) Medicines:            Propofol per Anesthesia Complications:        No immediate complications. Procedure:            Pre-Anesthesia Assessment:                       - After reviewing the risks and benefits, the patient                        was deemed in satisfactory condition to undergo the                        procedure.                       After obtaining informed consent, the colonoscope was                        passed under direct vision. Throughout the procedure,                        the patient's blood pressure, pulse, and oxygen                        saturations were monitored continuously. The                        Colonoscope was introduced through the anus and                        advanced to the the cecum, identified by appendiceal                        orifice and ileocecal valve. The colonoscopy was                        performed without difficulty. The patient tolerated the                        procedure well. The quality of the bowel preparation                        was excellent. Findings:      A small polyp was found in the cecum. The polyp was sessile. The polyp       was removed with a hot snare. Resection and retrieval were complete. To       prevent bleeding after the polypectomy, one  hemostatic clip was       successfully placed. There was no bleeding during, or at the end, of the   procedure.      A 15 mm polyp was found in the cecum. The polyp was sessile. The polyp       was demarcated with a saline injection-lift technique to lift the polyp       and then using a hot snare it was removed piecemeal in 4 pieces and then       the divot was closed with 4 clips. The edges of the removal site were       treated with argon to destroy small remaining spots of polyp tissue.      A single medium-sized localized angioectasia without bleeding was found       in the ascending colon.      A few medium-mouthed diverticula were found in the sigmoid colon.      Internal hemorrhoids were found during endoscopy. The hemorrhoids were       small and Grade I (internal hemorrhoids that do not prolapse). Impression:           - One small polyp in the cecum, removed with a hot                        snare. Resected and retrieved. Clip was placed.                       - One 15 mm polyp in the cecum, removed using                        injection-lift and a hot snare. Resected and retrieved.                       - A single non-bleeding colonic angioectasia.                       - Diverticulosis in the sigmoid colon.                       - Internal hemorrhoids. Recommendation:       - Await pathology results. Manya Silvas, MD 09/05/2016 9:07:43 AM This report has been signed electronically. Number of Addenda: 0 Note Initiated On: 09/05/2016 8:14 AM Scope Withdrawal Time: 0 hours 25 minutes 27 seconds  Total Procedure Duration: 0 hours 34 minutes 19 seconds       [facility]

## 2016-09-05 NOTE — Anesthesia Procedure Notes (Signed)
Date/Time: 09/05/2016 8:15 AM Performed by: Johnna Acosta Pre-anesthesia Checklist: Patient identified, Emergency Drugs available, Suction available, Patient being monitored and Timeout performed Patient Re-evaluated:Patient Re-evaluated prior to inductionOxygen Delivery Method: Nasal cannula

## 2016-09-05 NOTE — OR Nursing (Signed)
Patient has a history of c-diff.  Zosyn ordered for pro-op was not given and order discontinued per Dr. Vira Agar

## 2016-09-05 NOTE — Anesthesia Preprocedure Evaluation (Signed)
Anesthesia Evaluation  Patient identified by MRN, date of birth, ID band Patient awake    Reviewed: Allergy & Precautions, H&P , NPO status , Patient's Chart, lab work & pertinent test results, reviewed documented beta blocker date and time   Airway Mallampati: II   Neck ROM: full    Dental  (+) Poor Dentition   Pulmonary neg pulmonary ROS, shortness of breath, pneumonia, resolved,    Pulmonary exam normal        Cardiovascular negative cardio ROS Normal cardiovascular exam Rhythm:regular Rate:Normal     Neuro/Psych PSYCHIATRIC DISORDERS negative neurological ROS  negative psych ROS   GI/Hepatic negative GI ROS, Neg liver ROS,   Endo/Other  negative endocrine ROS  Renal/GU negative Renal ROS  negative genitourinary   Musculoskeletal   Abdominal   Peds  Hematology negative hematology ROS (+) anemia ,   Anesthesia Other Findings Past Medical History: 1951: Anemia No date: Arthritis No date: Chronic lower back pain No date: Depression No date: Eczema No date: Fibromyalgia     Comment: "went away after I stopped using artificial               sweeteners" No date: Jaundice     Comment: "w/gallstones" No date: Obesity No date: Osteopenia ~ 2002; 1995: Pneumonia No date: Shingles No date: Shortness of breath     Comment: exertion due to back No date: Urinary urgency No date: Venous stasis Past Surgical History: 1974: [place]; 1999: ANKLE FRACTURE SURGERY     Comment: left screws and plate 1997  and removed 1999 No date: BACK SURGERY     Comment: fusion 2008 L4-L5 No date: BACK SURGERY No date: BREAST BIOPSY     Comment: left; "it was fine" ~ 2009: CATARACT EXTRACTION W/ INTRAOCULAR LENS  IMPLA* 1966: CHOLECYSTECTOMY No date: EYE SURGERY     Comment: 3 surgeries left 2010,and 1 right eye 2009 No date: FRACTURE SURGERY 12/08/2014: JOINT REPLACEMENT Left 2006: KNEE ARTHROSCOPY  Comment: left  ?2011: LEFT EYE     Comment: 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 02/2011: MICRODISCECTOMY LUMBAR No date: NASAL RECONSTRUCTION WITH SEPTAL REPAIR 02/01/12: POSTERIOR FUSION LUMBAR SPINE 1953: TONSILLECTOMY BMI    Body Mass Index:  40.45 kg/m     Reproductive/Obstetrics negative OB ROS                             Anesthesia Physical Anesthesia Plan  ASA: III  Anesthesia Plan: General   Post-op Pain Management:    Induction:   Airway Management Planned:   Additional Equipment:   Intra-op Plan:   Post-operative Plan:   Informed Consent: I have reviewed the patients History and Physical, chart, labs and discussed the procedure including the risks, benefits and alternatives for the proposed anesthesia with the patient or authorized representative who has indicated his/her understanding and acceptance.   Dental Advisory Given  Plan Discussed with: CRNA  Anesthesia Plan Comments:         Anesthesia Quick Evaluation

## 2016-09-06 ENCOUNTER — Encounter: Payer: Self-pay | Admitting: Unknown Physician Specialty

## 2016-09-06 LAB — SURGICAL PATHOLOGY

## 2016-09-14 DIAGNOSIS — R7303 Prediabetes: Secondary | ICD-10-CM | POA: Diagnosis not present

## 2016-09-14 DIAGNOSIS — N183 Chronic kidney disease, stage 3 (moderate): Secondary | ICD-10-CM | POA: Diagnosis not present

## 2016-09-18 NOTE — Anesthesia Postprocedure Evaluation (Signed)
Anesthesia Post Note  Patient: Courtney Cherry  Procedure(s) Performed: Procedure(s) (LRB): COLONOSCOPY WITH PROPOFOL (N/A)  Patient location during evaluation: PACU Anesthesia Type: General Level of consciousness: awake and alert Pain management: pain level controlled Vital Signs Assessment: post-procedure vital signs reviewed and stable Respiratory status: spontaneous breathing, nonlabored ventilation, respiratory function stable and patient connected to nasal cannula oxygen Cardiovascular status: blood pressure returned to baseline and stable Postop Assessment: no signs of nausea or vomiting Anesthetic complications: no     Last Vitals:  Vitals:   09/05/16 0900 09/05/16 0910  BP: (!) 96/59 (!) 89/59  Pulse: 60 67  Resp: 16 17  Temp: (!) 35.6 C     Last Pain:  Vitals:   09/06/16 0740  TempSrc:   PainSc: 0-No pain                 Molli Barrows

## 2016-09-20 DIAGNOSIS — R7303 Prediabetes: Secondary | ICD-10-CM | POA: Diagnosis not present

## 2016-09-20 DIAGNOSIS — J0101 Acute recurrent maxillary sinusitis: Secondary | ICD-10-CM | POA: Diagnosis not present

## 2016-09-20 DIAGNOSIS — N183 Chronic kidney disease, stage 3 (moderate): Secondary | ICD-10-CM | POA: Diagnosis not present

## 2016-09-20 DIAGNOSIS — M5136 Other intervertebral disc degeneration, lumbar region: Secondary | ICD-10-CM | POA: Diagnosis not present

## 2016-09-21 ENCOUNTER — Other Ambulatory Visit: Payer: Self-pay | Admitting: Internal Medicine

## 2016-09-21 DIAGNOSIS — N183 Chronic kidney disease, stage 3 unspecified: Secondary | ICD-10-CM

## 2016-09-26 ENCOUNTER — Ambulatory Visit: Payer: PPO

## 2016-10-02 ENCOUNTER — Ambulatory Visit: Payer: PPO

## 2016-10-02 DIAGNOSIS — E119 Type 2 diabetes mellitus without complications: Secondary | ICD-10-CM | POA: Diagnosis not present

## 2016-10-02 DIAGNOSIS — N183 Chronic kidney disease, stage 3 (moderate): Secondary | ICD-10-CM | POA: Diagnosis not present

## 2016-10-02 DIAGNOSIS — J208 Acute bronchitis due to other specified organisms: Secondary | ICD-10-CM | POA: Diagnosis not present

## 2016-10-03 DIAGNOSIS — H018 Other specified inflammations of eyelid: Secondary | ICD-10-CM | POA: Diagnosis not present

## 2016-10-03 DIAGNOSIS — Z961 Presence of intraocular lens: Secondary | ICD-10-CM | POA: Diagnosis not present

## 2016-10-31 ENCOUNTER — Ambulatory Visit
Admission: RE | Admit: 2016-10-31 | Discharge: 2016-10-31 | Disposition: A | Discharge status: Home / Self Care | Payer: PPO | Source: Ambulatory Visit | Attending: Internal Medicine | Admitting: Internal Medicine

## 2016-10-31 DIAGNOSIS — R93421 Abnormal radiologic findings on diagnostic imaging of right kidney: Secondary | ICD-10-CM | POA: Insufficient documentation

## 2016-10-31 DIAGNOSIS — N19 Unspecified kidney failure: Secondary | ICD-10-CM | POA: Diagnosis not present

## 2016-10-31 DIAGNOSIS — N183 Chronic kidney disease, stage 3 unspecified: Secondary | ICD-10-CM

## 2016-10-31 DIAGNOSIS — R93422 Abnormal radiologic findings on diagnostic imaging of left kidney: Secondary | ICD-10-CM | POA: Diagnosis not present

## 2016-12-12 DIAGNOSIS — N183 Chronic kidney disease, stage 3 (moderate): Secondary | ICD-10-CM | POA: Diagnosis not present

## 2016-12-19 DIAGNOSIS — Z0000 Encounter for general adult medical examination without abnormal findings: Secondary | ICD-10-CM | POA: Diagnosis not present

## 2016-12-19 DIAGNOSIS — N183 Chronic kidney disease, stage 3 (moderate): Secondary | ICD-10-CM | POA: Diagnosis not present

## 2016-12-19 DIAGNOSIS — Z1231 Encounter for screening mammogram for malignant neoplasm of breast: Secondary | ICD-10-CM | POA: Diagnosis not present

## 2016-12-19 DIAGNOSIS — M8589 Other specified disorders of bone density and structure, multiple sites: Secondary | ICD-10-CM | POA: Diagnosis not present

## 2016-12-19 DIAGNOSIS — R7303 Prediabetes: Secondary | ICD-10-CM | POA: Diagnosis not present

## 2016-12-19 DIAGNOSIS — Z23 Encounter for immunization: Secondary | ICD-10-CM | POA: Diagnosis not present

## 2016-12-19 DIAGNOSIS — M5136 Other intervertebral disc degeneration, lumbar region: Secondary | ICD-10-CM | POA: Diagnosis not present

## 2016-12-28 ENCOUNTER — Other Ambulatory Visit: Payer: Self-pay | Admitting: Internal Medicine

## 2016-12-28 DIAGNOSIS — Z1231 Encounter for screening mammogram for malignant neoplasm of breast: Secondary | ICD-10-CM

## 2017-01-21 ENCOUNTER — Ambulatory Visit
Admission: RE | Admit: 2017-01-21 | Discharge: 2017-01-21 | Disposition: A | Discharge status: Home / Self Care | Payer: PPO | Source: Ambulatory Visit | Attending: Internal Medicine | Admitting: Internal Medicine

## 2017-01-21 DIAGNOSIS — Z1231 Encounter for screening mammogram for malignant neoplasm of breast: Secondary | ICD-10-CM | POA: Diagnosis not present

## 2017-02-01 DIAGNOSIS — I781 Nevus, non-neoplastic: Secondary | ICD-10-CM | POA: Diagnosis not present

## 2017-02-01 DIAGNOSIS — D229 Melanocytic nevi, unspecified: Secondary | ICD-10-CM | POA: Diagnosis not present

## 2017-02-01 DIAGNOSIS — I8393 Asymptomatic varicose veins of bilateral lower extremities: Secondary | ICD-10-CM | POA: Diagnosis not present

## 2017-02-01 DIAGNOSIS — L820 Inflamed seborrheic keratosis: Secondary | ICD-10-CM | POA: Diagnosis not present

## 2017-02-01 DIAGNOSIS — L219 Seborrheic dermatitis, unspecified: Secondary | ICD-10-CM | POA: Diagnosis not present

## 2017-02-01 DIAGNOSIS — L649 Androgenic alopecia, unspecified: Secondary | ICD-10-CM | POA: Diagnosis not present

## 2017-02-01 DIAGNOSIS — L821 Other seborrheic keratosis: Secondary | ICD-10-CM | POA: Diagnosis not present

## 2017-03-15 DIAGNOSIS — N183 Chronic kidney disease, stage 3 (moderate): Secondary | ICD-10-CM | POA: Diagnosis not present

## 2017-03-15 DIAGNOSIS — E119 Type 2 diabetes mellitus without complications: Secondary | ICD-10-CM | POA: Diagnosis not present

## 2017-03-22 DIAGNOSIS — R7303 Prediabetes: Secondary | ICD-10-CM | POA: Diagnosis not present

## 2017-03-22 DIAGNOSIS — R21 Rash and other nonspecific skin eruption: Secondary | ICD-10-CM | POA: Diagnosis not present

## 2017-03-22 DIAGNOSIS — E7800 Pure hypercholesterolemia, unspecified: Secondary | ICD-10-CM | POA: Insufficient documentation

## 2017-03-22 DIAGNOSIS — N183 Chronic kidney disease, stage 3 (moderate): Secondary | ICD-10-CM | POA: Diagnosis not present

## 2017-04-10 DIAGNOSIS — H5371 Glare sensitivity: Secondary | ICD-10-CM | POA: Diagnosis not present

## 2017-04-24 DIAGNOSIS — E119 Type 2 diabetes mellitus without complications: Secondary | ICD-10-CM | POA: Diagnosis not present

## 2017-04-24 DIAGNOSIS — M25561 Pain in right knee: Secondary | ICD-10-CM | POA: Diagnosis not present

## 2017-04-24 DIAGNOSIS — Z6841 Body Mass Index (BMI) 40.0 and over, adult: Secondary | ICD-10-CM | POA: Diagnosis not present

## 2017-04-24 DIAGNOSIS — Z96652 Presence of left artificial knee joint: Secondary | ICD-10-CM | POA: Diagnosis not present

## 2017-04-24 DIAGNOSIS — M1711 Unilateral primary osteoarthritis, right knee: Secondary | ICD-10-CM | POA: Diagnosis not present

## 2017-04-30 DIAGNOSIS — R399 Unspecified symptoms and signs involving the genitourinary system: Secondary | ICD-10-CM | POA: Diagnosis not present

## 2017-04-30 DIAGNOSIS — N390 Urinary tract infection, site not specified: Secondary | ICD-10-CM | POA: Diagnosis not present

## 2017-06-17 DIAGNOSIS — R42 Dizziness and giddiness: Secondary | ICD-10-CM | POA: Insufficient documentation

## 2017-06-17 DIAGNOSIS — J3089 Other allergic rhinitis: Secondary | ICD-10-CM | POA: Diagnosis not present

## 2017-06-17 DIAGNOSIS — J0100 Acute maxillary sinusitis, unspecified: Secondary | ICD-10-CM | POA: Diagnosis not present

## 2017-06-17 DIAGNOSIS — J302 Other seasonal allergic rhinitis: Secondary | ICD-10-CM | POA: Insufficient documentation

## 2017-09-18 DIAGNOSIS — E7800 Pure hypercholesterolemia, unspecified: Secondary | ICD-10-CM | POA: Diagnosis not present

## 2017-09-18 DIAGNOSIS — N183 Chronic kidney disease, stage 3 (moderate): Secondary | ICD-10-CM | POA: Diagnosis not present

## 2017-09-18 DIAGNOSIS — R7303 Prediabetes: Secondary | ICD-10-CM | POA: Diagnosis not present

## 2017-09-25 DIAGNOSIS — M8589 Other specified disorders of bone density and structure, multiple sites: Secondary | ICD-10-CM | POA: Diagnosis not present

## 2017-09-25 DIAGNOSIS — L989 Disorder of the skin and subcutaneous tissue, unspecified: Secondary | ICD-10-CM | POA: Diagnosis not present

## 2017-09-25 DIAGNOSIS — Z0000 Encounter for general adult medical examination without abnormal findings: Secondary | ICD-10-CM | POA: Diagnosis not present

## 2017-09-25 DIAGNOSIS — R911 Solitary pulmonary nodule: Secondary | ICD-10-CM | POA: Diagnosis not present

## 2017-09-25 DIAGNOSIS — Z1231 Encounter for screening mammogram for malignant neoplasm of breast: Secondary | ICD-10-CM | POA: Diagnosis not present

## 2017-09-26 ENCOUNTER — Other Ambulatory Visit: Payer: Self-pay | Admitting: Internal Medicine

## 2017-09-26 DIAGNOSIS — R911 Solitary pulmonary nodule: Secondary | ICD-10-CM

## 2017-09-30 ENCOUNTER — Other Ambulatory Visit: Payer: Self-pay | Admitting: Internal Medicine

## 2017-09-30 DIAGNOSIS — Z1231 Encounter for screening mammogram for malignant neoplasm of breast: Secondary | ICD-10-CM

## 2017-10-08 ENCOUNTER — Ambulatory Visit
Admission: RE | Admit: 2017-10-08 | Discharge: 2017-10-08 | Disposition: A | Discharge status: Home / Self Care | Payer: PPO | Source: Ambulatory Visit | Attending: Internal Medicine | Admitting: Internal Medicine

## 2017-10-08 DIAGNOSIS — R918 Other nonspecific abnormal finding of lung field: Secondary | ICD-10-CM | POA: Diagnosis not present

## 2017-10-08 DIAGNOSIS — K449 Diaphragmatic hernia without obstruction or gangrene: Secondary | ICD-10-CM | POA: Insufficient documentation

## 2017-10-08 DIAGNOSIS — R911 Solitary pulmonary nodule: Secondary | ICD-10-CM

## 2017-10-25 DIAGNOSIS — Z8371 Family history of colonic polyps: Secondary | ICD-10-CM | POA: Diagnosis not present

## 2017-10-25 DIAGNOSIS — Z96652 Presence of left artificial knee joint: Secondary | ICD-10-CM | POA: Diagnosis not present

## 2017-10-25 DIAGNOSIS — E119 Type 2 diabetes mellitus without complications: Secondary | ICD-10-CM | POA: Diagnosis not present

## 2017-10-25 DIAGNOSIS — Z86010 Personal history of colonic polyps: Secondary | ICD-10-CM | POA: Diagnosis not present

## 2017-11-01 DIAGNOSIS — D225 Melanocytic nevi of trunk: Secondary | ICD-10-CM | POA: Diagnosis not present

## 2017-11-01 DIAGNOSIS — D2271 Melanocytic nevi of right lower limb, including hip: Secondary | ICD-10-CM | POA: Diagnosis not present

## 2017-11-01 DIAGNOSIS — L814 Other melanin hyperpigmentation: Secondary | ICD-10-CM | POA: Diagnosis not present

## 2017-11-01 DIAGNOSIS — D2262 Melanocytic nevi of left upper limb, including shoulder: Secondary | ICD-10-CM | POA: Diagnosis not present

## 2017-12-18 DIAGNOSIS — M8589 Other specified disorders of bone density and structure, multiple sites: Secondary | ICD-10-CM | POA: Diagnosis not present

## 2018-01-22 ENCOUNTER — Ambulatory Visit
Admission: RE | Admit: 2018-01-22 | Discharge: 2018-01-22 | Disposition: A | Discharge status: Home / Self Care | Payer: PPO | Source: Ambulatory Visit | Attending: Internal Medicine | Admitting: Internal Medicine

## 2018-01-22 DIAGNOSIS — Z1231 Encounter for screening mammogram for malignant neoplasm of breast: Secondary | ICD-10-CM

## 2018-01-28 ENCOUNTER — Encounter: Payer: Self-pay | Admitting: *Deleted

## 2018-01-29 ENCOUNTER — Encounter
Admission: RE | Disposition: A | Discharge status: Home / Self Care | Payer: Self-pay | Source: Ambulatory Visit | Attending: Unknown Physician Specialty

## 2018-01-29 ENCOUNTER — Encounter: Payer: Self-pay | Admitting: Student

## 2018-01-29 ENCOUNTER — Other Ambulatory Visit: Payer: Self-pay

## 2018-01-29 ENCOUNTER — Ambulatory Visit: Payer: PPO | Admitting: Certified Registered Nurse Anesthetist

## 2018-01-29 ENCOUNTER — Ambulatory Visit
Admission: RE | Admit: 2018-01-29 | Discharge: 2018-01-29 | Disposition: A | Discharge status: Home / Self Care | Payer: PPO | Source: Ambulatory Visit | Attending: Unknown Physician Specialty | Admitting: Unknown Physician Specialty

## 2018-01-29 DIAGNOSIS — F329 Major depressive disorder, single episode, unspecified: Secondary | ICD-10-CM | POA: Diagnosis not present

## 2018-01-29 DIAGNOSIS — I878 Other specified disorders of veins: Secondary | ICD-10-CM | POA: Diagnosis not present

## 2018-01-29 DIAGNOSIS — M5136 Other intervertebral disc degeneration, lumbar region: Secondary | ICD-10-CM | POA: Insufficient documentation

## 2018-01-29 DIAGNOSIS — M170 Bilateral primary osteoarthritis of knee: Secondary | ICD-10-CM | POA: Insufficient documentation

## 2018-01-29 DIAGNOSIS — M479 Spondylosis, unspecified: Secondary | ICD-10-CM | POA: Insufficient documentation

## 2018-01-29 DIAGNOSIS — K5730 Diverticulosis of large intestine without perforation or abscess without bleeding: Secondary | ICD-10-CM | POA: Insufficient documentation

## 2018-01-29 DIAGNOSIS — Z6841 Body Mass Index (BMI) 40.0 and over, adult: Secondary | ICD-10-CM | POA: Diagnosis not present

## 2018-01-29 DIAGNOSIS — L309 Dermatitis, unspecified: Secondary | ICD-10-CM | POA: Insufficient documentation

## 2018-01-29 DIAGNOSIS — Z803 Family history of malignant neoplasm of breast: Secondary | ICD-10-CM | POA: Insufficient documentation

## 2018-01-29 DIAGNOSIS — Z91048 Other nonmedicinal substance allergy status: Secondary | ICD-10-CM | POA: Insufficient documentation

## 2018-01-29 DIAGNOSIS — R911 Solitary pulmonary nodule: Secondary | ICD-10-CM | POA: Diagnosis not present

## 2018-01-29 DIAGNOSIS — K640 First degree hemorrhoids: Secondary | ICD-10-CM | POA: Diagnosis not present

## 2018-01-29 DIAGNOSIS — Z90710 Acquired absence of both cervix and uterus: Secondary | ICD-10-CM | POA: Insufficient documentation

## 2018-01-29 DIAGNOSIS — G8929 Other chronic pain: Secondary | ICD-10-CM | POA: Insufficient documentation

## 2018-01-29 DIAGNOSIS — K5790 Diverticulosis of intestine, part unspecified, without perforation or abscess without bleeding: Secondary | ICD-10-CM | POA: Diagnosis not present

## 2018-01-29 DIAGNOSIS — Z86010 Personal history of colonic polyps: Secondary | ICD-10-CM | POA: Insufficient documentation

## 2018-01-29 DIAGNOSIS — K648 Other hemorrhoids: Secondary | ICD-10-CM | POA: Diagnosis not present

## 2018-01-29 DIAGNOSIS — Z8371 Family history of colonic polyps: Secondary | ICD-10-CM | POA: Diagnosis not present

## 2018-01-29 DIAGNOSIS — E559 Vitamin D deficiency, unspecified: Secondary | ICD-10-CM | POA: Diagnosis not present

## 2018-01-29 DIAGNOSIS — R3915 Urgency of urination: Secondary | ICD-10-CM | POA: Insufficient documentation

## 2018-01-29 DIAGNOSIS — N183 Chronic kidney disease, stage 3 (moderate): Secondary | ICD-10-CM | POA: Insufficient documentation

## 2018-01-29 DIAGNOSIS — M1990 Unspecified osteoarthritis, unspecified site: Secondary | ICD-10-CM | POA: Diagnosis not present

## 2018-01-29 DIAGNOSIS — E7800 Pure hypercholesterolemia, unspecified: Secondary | ICD-10-CM | POA: Diagnosis not present

## 2018-01-29 DIAGNOSIS — D125 Benign neoplasm of sigmoid colon: Secondary | ICD-10-CM | POA: Diagnosis not present

## 2018-01-29 DIAGNOSIS — E1122 Type 2 diabetes mellitus with diabetic chronic kidney disease: Secondary | ICD-10-CM | POA: Diagnosis not present

## 2018-01-29 DIAGNOSIS — R0602 Shortness of breath: Secondary | ICD-10-CM | POA: Diagnosis not present

## 2018-01-29 DIAGNOSIS — M545 Low back pain: Secondary | ICD-10-CM | POA: Diagnosis not present

## 2018-01-29 DIAGNOSIS — Z79899 Other long term (current) drug therapy: Secondary | ICD-10-CM | POA: Insufficient documentation

## 2018-01-29 DIAGNOSIS — Z9049 Acquired absence of other specified parts of digestive tract: Secondary | ICD-10-CM | POA: Insufficient documentation

## 2018-01-29 DIAGNOSIS — Z882 Allergy status to sulfonamides status: Secondary | ICD-10-CM | POA: Insufficient documentation

## 2018-01-29 DIAGNOSIS — Z96652 Presence of left artificial knee joint: Secondary | ICD-10-CM | POA: Insufficient documentation

## 2018-01-29 DIAGNOSIS — K635 Polyp of colon: Secondary | ICD-10-CM | POA: Diagnosis not present

## 2018-01-29 DIAGNOSIS — Z888 Allergy status to other drugs, medicaments and biological substances status: Secondary | ICD-10-CM | POA: Insufficient documentation

## 2018-01-29 DIAGNOSIS — N189 Chronic kidney disease, unspecified: Secondary | ICD-10-CM | POA: Diagnosis not present

## 2018-01-29 DIAGNOSIS — Z1211 Encounter for screening for malignant neoplasm of colon: Secondary | ICD-10-CM | POA: Insufficient documentation

## 2018-01-29 DIAGNOSIS — D649 Anemia, unspecified: Secondary | ICD-10-CM | POA: Diagnosis not present

## 2018-01-29 DIAGNOSIS — Z9842 Cataract extraction status, left eye: Secondary | ICD-10-CM | POA: Insufficient documentation

## 2018-01-29 DIAGNOSIS — Z9841 Cataract extraction status, right eye: Secondary | ICD-10-CM | POA: Insufficient documentation

## 2018-01-29 HISTORY — DX: Benign neoplasm of colon, unspecified: D12.6

## 2018-01-29 HISTORY — DX: Enterocolitis due to Clostridium difficile, not specified as recurrent: A04.72

## 2018-01-29 HISTORY — PX: COLONOSCOPY WITH PROPOFOL: SHX5780

## 2018-01-29 HISTORY — DX: Other intervertebral disc degeneration, lumbar region without mention of lumbar back pain or lower extremity pain: M51.369

## 2018-01-29 HISTORY — DX: Pure hypercholesterolemia, unspecified: E78.00

## 2018-01-29 HISTORY — DX: Type 2 diabetes mellitus without complications: E11.9

## 2018-01-29 HISTORY — DX: Unspecified osteoarthritis, unspecified site: M19.90

## 2018-01-29 HISTORY — DX: Solitary pulmonary nodule: R91.1

## 2018-01-29 HISTORY — DX: Polyp of colon: K63.5

## 2018-01-29 HISTORY — DX: Other intervertebral disc degeneration, lumbar region: M51.36

## 2018-01-29 HISTORY — DX: Vitamin D deficiency, unspecified: E55.9

## 2018-01-29 HISTORY — DX: Chronic kidney disease, unspecified: N18.9

## 2018-01-29 LAB — GLUCOSE, CAPILLARY: GLUCOSE-CAPILLARY: 119 mg/dL — AB (ref 65–99)

## 2018-01-29 SURGERY — COLONOSCOPY WITH PROPOFOL
Anesthesia: General

## 2018-01-29 MED ORDER — PHENYLEPHRINE HCL 10 MG/ML IJ SOLN
INTRAMUSCULAR | Status: DC | PRN
  Administered 2018-01-29 (×3): 100 ug via INTRAVENOUS

## 2018-01-29 MED ORDER — PIPERACILLIN-TAZOBACTAM 3.375 G IVPB 30 MIN
3.3750 g | Freq: Once | INTRAVENOUS | Status: AC
  Administered 2018-01-29: 3.375 g via INTRAVENOUS
  Filled 2018-01-29: qty 50

## 2018-01-29 MED ORDER — LIDOCAINE HCL (PF) 2 % IJ SOLN
INTRAMUSCULAR | Status: AC
  Filled 2018-01-29: qty 10

## 2018-01-29 MED ORDER — SODIUM CHLORIDE 0.9 % IV SOLN
INTRAVENOUS | Status: DC
  Administered 2018-01-29: 09:00:00 via INTRAVENOUS

## 2018-01-29 MED ORDER — LIDOCAINE HCL (CARDIAC) PF 100 MG/5ML IV SOSY
PREFILLED_SYRINGE | INTRAVENOUS | Status: DC | PRN
  Administered 2018-01-29: 50 mg via INTRAVENOUS

## 2018-01-29 MED ORDER — PROPOFOL 500 MG/50ML IV EMUL
INTRAVENOUS | Status: DC | PRN
  Administered 2018-01-29: 130 ug/kg/min via INTRAVENOUS

## 2018-01-29 MED ORDER — PROPOFOL 10 MG/ML IV BOLUS
INTRAVENOUS | Status: DC | PRN
  Administered 2018-01-29: 27 mg via INTRAVENOUS
  Administered 2018-01-29: 100 mg via INTRAVENOUS

## 2018-01-29 MED ORDER — PROPOFOL 500 MG/50ML IV EMUL
INTRAVENOUS | Status: AC
  Filled 2018-01-29: qty 50

## 2018-01-29 MED ORDER — SODIUM CHLORIDE 0.9 % IV SOLN: INTRAVENOUS | Status: DC

## 2018-01-29 NOTE — H&P (Signed)
Primary Care Physician:  Glendon Axe, MD Primary Gastroenterologist:  Dr. Vira Agar  Pre-Procedure History & Physical: HPI:  Courtney Cherry is a 72 y.o. female is here for an colonoscopy.  Done for personal history of colon polyps,   Past Medical History:  Diagnosis Date  . Anemia 1951  . Arthritis   . C. difficile diarrhea   . Chronic kidney disease    chronic kidney disease Stage 3   . Chronic lower back pain   . Colon polyp    family history  . DDD (degenerative disc disease), lumbar   . Depression   . Diabetes mellitus without complication ([place])   . Eczema   . Fibromyalgia    "went away after I stopped using artificial sweeteners"  . Hypercholesteremia   . Jaundice    "w/gallstones"  . Obesity   . Obesity   . Osteoarthritis    lumbar spine and knees  . Osteopenia   . Pneumonia ~ 2002; 1995  . Polyp of colon, adenomatous   . Pulmonary nodule   . Shingles   . Shortness of breath    exertion due to back  . Urinary urgency   . Venous stasis   . Vitamin D deficiency   . Vitamin D deficiency     Past Surgical History:  Procedure Laterality Date  . ABDOMINAL HYSTERECTOMY  1974  . [place]; 1999   left screws and plate 1997  and removed 1999  . BACK SURGERY     fusion 2008 L4-L5  . BACK SURGERY    . BREAST BIOPSY Left 2000s   NEG  . CATARACT EXTRACTION W/ INTRAOCULAR LENS  IMPLANT, BILATERAL  ~ 2009  . CHOLECYSTECTOMY  1966  . COLONOSCOPY WITH PROPOFOL N/A 09/05/2016   Procedure: COLONOSCOPY WITH PROPOFOL;  Surgeon: Manya Silvas, MD;  Location[facility] ENDOSCOPY;  Service: Endoscopy;  Laterality: N/A;  . EYE SURGERY     3 surgeries left 2010,and 1 right eye 2009  . FRACTURE SURGERY    . JOINT REPLACEMENT Left 12/08/2014  . KNEE ARTHROSCOPY  2006   left   . 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  . MICRODISCECTOMY LUMBAR  02/2011  . NASAL RECONSTRUCTION WITH SEPTAL REPAIR    . OOPHORECTOMY      one ovary left  . ORIF ANKLE FRACTURE     left ankle reconstuction  . POSTERIOR FUSION LUMBAR SPINE  02/01/12  . TONSILLECTOMY  1953  . TOTAL KNEE ARTHROPLASTY Left     Prior to Admission medications   Medication Sig Start Date End Date Taking? Authorizing Provider  acetaminophen (TYLENOL) 500 MG tablet Take 1,000 mg by mouth every 4 (four) hours as needed. For pain   Yes [provider]  CALCIUM-MAGNESIUM-VITAMIN D PO Take 1 tablet by mouth 2 (two) times daily.   Yes [provider]  ergocalciferol (VITAMIN D2) 50000 units capsule Take 50,000 Units by mouth daily.   Yes [provider]  Lactobacillus (FLORANEX PO) Take by mouth every morning.   Yes [provider]  montelukast (SINGULAIR) 10 MG tablet Take 10 mg by mouth at bedtime.   Yes [provider]  betamethasone dipropionate (DIPROLENE) 0.05 % ointment Apply topically 2 (two) times daily.    [provider]  Camphor-Menthol-Capsicum (TIGER BALM PAIN RELIEVING) 80-24-16 MG PTCH Apply 1 patch topically every morning.    [provider]  COENZYME  Q-10 PO Take 1 tablet by mouth daily.    [provider]  etodolac (LODINE) 500 MG tablet Take 500 mg by mouth 2 (two) times daily.    [provider]  fish oil-omega-3 fatty acids 1000 MG capsule Take 1 g by mouth daily.    [provider]  Glucosamine-Chondroitin (GLUCOSAMINE CHONDR COMPLEX PO) Take 5 tablets by mouth daily.    [provider]  GREEN COFFEE BEAN PO Take 1 tablet by mouth 2 (two) times daily before a meal.    [provider]  HYDROcodone-acetaminophen (VICODIN) 5-500 MG per tablet Take 1 tablet by mouth every 4 (four) hours as needed. For pain    [provider]  ondansetron (ZOFRAN) 4 MG tablet Take 4 mg by mouth every 8 (eight) hours as needed for nausea or vomiting.    [provider]  OVER THE COUNTER MEDICATION Take 1 tablet by mouth daily. "Solar  #7" herbal pain medication    [provider]  traMADol (ULTRAM) 50 MG tablet Take 100 mg by mouth at bedtime as needed. For pain    [provider]    Allergies as of 10/26/2017 - Review Complete 09/05/2016  Allergen Reaction Noted  . Sulfa antibiotics Rash 01/02/2012  . Adhesive [tape] Other (See Comments) 01/31/2012  . Valium [diazepam] Other (See Comments) 01/02/2012  . Castor oil Other (See Comments) 08/31/2016    Family History  Problem Relation Age of Onset  . Breast cancer Paternal Grandmother 55       and 21  . Anesthesia problems Neg Hx   . Hypotension Neg Hx   . Malignant hyperthermia Neg Hx   . Pseudochol deficiency Neg Hx     Social History   Socioeconomic History  . Marital status: Married    Spouse name: Not on file  . Number of children: Not on file  . Years of education: Not on file  . Highest education level: Not on file  Occupational History  . Not on file  Social Needs  . Financial resource strain: Not on file  . Food insecurity:    Worry: Not on file    Inability: Not on file  . Transportation needs:    Medical: Not on file    Non-medical: Not on file  Tobacco Use  . Smoking status: Never Smoker  . Smokeless tobacco: Never Used  Substance and Sexual Activity  . Alcohol use: No  . Drug use: No  . Sexual activity: Not Currently  Lifestyle  . Physical activity:    Days per week: Not on file    Minutes per session: Not on file  . Stress: Not on file  Relationships  . Social connections:    Talks on phone: Not on file    Gets together: Not on file    Attends religious service: Not on file    Active member of club or organization: Not on file    Attends meetings of clubs or organizations: Not on file    Relationship status: Not on file  . Intimate partner violence:    Fear of current or ex partner: Not on file    Emotionally abused: Not on file    Physically abused: Not on file    Forced sexual activity: Not on file   Other Topics Concern  . Not on file  Social History Narrative  . Not on file    Review of Systems: See HPI, otherwise negative ROS  Physical Exam: BP  106/70   Pulse 83   Temp (!) 96.5 F (35.8 C) (Tympanic)   Resp 16   Ht 5\' 3"  (1.6 m)   Wt 107.5 kg (237 lb)   SpO2 97%   BMI 41.98 kg/m  General:   Alert,  pleasant and cooperative in NAD Head:  Normocephalic and atraumatic. Neck:  Supple; no masses or thyromegaly. Lungs:  Clear throughout to auscultation.    Heart:  Regular rate and rhythm. Abdomen:  Soft, nontender and nondistended. Normal bowel sounds, without guarding, and without rebound.   Neurologic:  Alert and  oriented x4;  grossly normal neurologically.  Impression/Plan: Courtney Cherry is here for an colonoscopy to be performed for Personal history of colon polyps.  Risks, benefits, limitations, and alternatives regarding  colonoscopy have been reviewed with the patient.  Questions have been answered.  All parties agreeable.   Gaylyn Cheers, MD  01/29/2018, 9:23 AM

## 2018-01-29 NOTE — Op Note (Signed)
[facility]astroenter[facility] Patient Name: Courtney Cherry Procedure Date: 01/29/2018 9:13 AM MRN: [id number] Account #: [id number] Date of Birth: 1946-04-25 Admit Type: Outpatient Age: 72 Room: [facility] ENDO ROOM 3 Gender: Female Note Status: Finalized Procedure:            Colonoscopy Indications:          High risk colon cancer surveillance: Personal history                        of colonic polyps Providers:            Manya Silvas, MD Referring MD:         Glendon Axe (Referring MD) Medicines:            Propofol per Anesthesia Complications:        No immediate complications. Procedure:            Pre-Anesthesia Assessment:                       - After reviewing the risks and benefits, the patient                        was deemed in satisfactory condition to undergo the                        procedure.                       After obtaining informed consent, the colonoscope was                        passed under direct vision. Throughout the procedure,                        the patient's blood pressure, pulse, and oxygen                        saturations were monitored continuously. The                        Colonoscope was introduced through the anus and                        advanced to the the cecum, identified by appendiceal                        orifice and ileocecal valve. The colonoscopy was                        performed without difficulty. The patient tolerated the                        procedure well. The quality of the bowel preparation                        was excellent. Findings:      A diminutive polyp was found in the distal sigmoid colon. The polyp was       sessile. The polyp was removed with a jumbo cold forceps. Resection and       retrieval were complete.      A few medium-mouthed diverticula were  found in the sigmoid colon.      Internal hemorrhoids were found during endoscopy. The hemorrhoids were       small,  medium-sized and Grade I (internal hemorrhoids that do not       prolapse).      The exam was otherwise without abnormality. Impression:           - One diminutive polyp in the distal sigmoid colon,                        removed with a jumbo cold forceps. Resected and                        retrieved.                       - Diverticulosis in the sigmoid colon.                       - Internal hemorrhoids.                       - The examination was otherwise normal. Recommendation:       - Await pathology results. Manya Silvas, MD 01/29/2018 10:00:02 AM This report has been signed electronically. Number of Addenda: 0 Note Initiated On: 01/29/2018 9:13 AM Scope Withdrawal Time: 0 hours 11 minutes 5 seconds  Total Procedure Duration: 0 hours 18 minutes 54 seconds       [facility]

## 2018-01-29 NOTE — Transfer of Care (Signed)
Immediate Anesthesia Transfer of Care Note  Patient: Courtney Cherry  Procedure(s) Performed: COLONOSCOPY WITH PROPOFOL (N/A )  Patient Location: PACU and Endoscopy Unit  Anesthesia Type:General  Level of Consciousness: drowsy  Airway & Oxygen Therapy: Patient Spontanous Breathing and Patient connected to nasal cannula oxygen  Post-op Assessment: Report given to RN and Post -op Vital signs reviewed and stable  Post vital signs: Reviewed and stable  Last Vitals:  Vitals Value Taken Time  BP 94/53 01/29/2018 10:02 AM  Temp    Pulse 74 01/29/2018 10:02 AM  Resp 22 01/29/2018 10:02 AM  SpO2 97 % 01/29/2018 10:02 AM  Vitals shown include unvalidated device data.  Last Pain:  Vitals:   01/29/18 1000  TempSrc: (P) Tympanic  PainSc:          Complications: No apparent anesthesia complications

## 2018-01-29 NOTE — Anesthesia Post-op Follow-up Note (Signed)
Anesthesia QCDR form completed.        

## 2018-01-29 NOTE — Anesthesia Postprocedure Evaluation (Signed)
Anesthesia Post Note  Patient: Courtney Cherry  Procedure(s) Performed: COLONOSCOPY WITH PROPOFOL (N/A )  Patient location during evaluation: Endoscopy Anesthesia Type: General Level of consciousness: awake and alert Pain management: pain level controlled Vital Signs Assessment: post-procedure vital signs reviewed and stable Respiratory status: spontaneous breathing, nonlabored ventilation, respiratory function stable and patient connected to nasal cannula oxygen Cardiovascular status: blood pressure returned to baseline and stable Postop Assessment: no apparent nausea or vomiting Anesthetic complications: no     Last Vitals:  Vitals:   01/29/18 1020 01/29/18 1030  BP: 106/63 115/78  Pulse: 74 70  Resp: 17 18  Temp:    SpO2: 99% 100%    Last Pain:  Vitals:   01/29/18 1000  TempSrc: Tympanic  PainSc:                  Martha Clan

## 2018-01-29 NOTE — Anesthesia Preprocedure Evaluation (Signed)
Anesthesia Evaluation  Patient identified by MRN, date of birth, ID band Patient awake    Reviewed: Allergy & Precautions, H&P , NPO status , Patient's Chart, lab work & pertinent test results, reviewed documented beta blocker date and time   History of Anesthesia Complications Negative for: history of anesthetic complications  Airway Mallampati: II   Neck ROM: full    Dental  (+) Poor Dentition, Dental Advidsory Given   Pulmonary neg pulmonary ROS, shortness of breath, pneumonia, resolved,           Cardiovascular negative cardio ROS       Neuro/Psych PSYCHIATRIC DISORDERS Depression  Neuromuscular disease (fibromyalgia)    GI/Hepatic negative GI ROS, Neg liver ROS,   Endo/Other  negative endocrine ROSdiabetesMorbid obesity  Renal/GU CRFRenal disease  negative genitourinary   Musculoskeletal   Abdominal   Peds  Hematology negative hematology ROS (+) anemia ,   Anesthesia Other Findings Past Medical History: 1951: Anemia No date: Arthritis No date: Chronic lower back pain No date: Depression No date: Eczema No date: Fibromyalgia     Comment: "went away after I stopped using artificial               sweeteners" No date: Jaundice     Comment: "w/gallstones" No date: Obesity No date: Osteopenia ~ 2002; 1995: Pneumonia No date: Shingles No date: Shortness of breath     Comment: exertion due to back No date: Urinary urgency No date: Venous stasis Past Surgical History: 1974: [place]; 1999: ANKLE FRACTURE SURGERY     Comment: left screws and plate 1997  and removed 1999 No date: BACK SURGERY     Comment: fusion 2008 L4-L5 No date: BACK SURGERY No date: BREAST BIOPSY     Comment: left; "it was fine" ~ 2009: CATARACT EXTRACTION W/ INTRAOCULAR LENS  IMPLA* 1966: CHOLECYSTECTOMY No date: EYE SURGERY     Comment: 3 surgeries left 2010,and 1 right eye 2009 No date: FRACTURE  SURGERY 12/08/2014: JOINT REPLACEMENT Left 2006: KNEE ARTHROSCOPY     Comment: left  ?2011: LEFT EYE     Comment: 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 02/2011: MICRODISCECTOMY LUMBAR No date: NASAL RECONSTRUCTION WITH SEPTAL REPAIR 02/01/12: POSTERIOR FUSION LUMBAR SPINE 1953: TONSILLECTOMY BMI    Body Mass Index:  40.45 kg/m     Reproductive/Obstetrics negative OB ROS                             Anesthesia Physical  Anesthesia Plan  ASA: III  Anesthesia Plan: General   Post-op Pain Management:    Induction: Intravenous  PONV Risk Score and Plan: 2 and Propofol infusion  Airway Management Planned: Nasal Cannula  Additional Equipment:   Intra-op Plan:   Post-operative Plan:   Informed Consent: I have reviewed the patients History and Physical, chart, labs and discussed the procedure including the risks, benefits and alternatives for the proposed anesthesia with the patient or authorized representative who has indicated his/her understanding and acceptance.   Dental Advisory Given  Plan Discussed with: CRNA  Anesthesia Plan Comments:         Anesthesia Quick Evaluation

## 2018-01-30 LAB — SURGICAL PATHOLOGY

## 2018-01-31 ENCOUNTER — Encounter: Payer: Self-pay | Admitting: Unknown Physician Specialty

## 2018-02-05 DIAGNOSIS — H5371 Glare sensitivity: Secondary | ICD-10-CM | POA: Diagnosis not present

## 2018-02-22 DIAGNOSIS — N390 Urinary tract infection, site not specified: Secondary | ICD-10-CM | POA: Diagnosis not present

## 2018-02-22 DIAGNOSIS — R300 Dysuria: Secondary | ICD-10-CM | POA: Diagnosis not present

## 2018-02-26 DIAGNOSIS — N390 Urinary tract infection, site not specified: Secondary | ICD-10-CM | POA: Diagnosis not present

## 2018-03-26 DIAGNOSIS — N183 Chronic kidney disease, stage 3 (moderate): Secondary | ICD-10-CM | POA: Diagnosis not present

## 2018-03-26 DIAGNOSIS — E119 Type 2 diabetes mellitus without complications: Secondary | ICD-10-CM | POA: Diagnosis not present

## 2018-04-02 DIAGNOSIS — R300 Dysuria: Secondary | ICD-10-CM | POA: Diagnosis not present

## 2018-04-02 DIAGNOSIS — N952 Postmenopausal atrophic vaginitis: Secondary | ICD-10-CM | POA: Insufficient documentation

## 2018-07-14 DIAGNOSIS — R300 Dysuria: Secondary | ICD-10-CM | POA: Diagnosis not present

## 2018-07-14 DIAGNOSIS — N3000 Acute cystitis without hematuria: Secondary | ICD-10-CM | POA: Diagnosis not present

## 2018-07-30 DIAGNOSIS — R399 Unspecified symptoms and signs involving the genitourinary system: Secondary | ICD-10-CM | POA: Diagnosis not present

## 2018-08-24 DIAGNOSIS — R300 Dysuria: Secondary | ICD-10-CM | POA: Diagnosis not present

## 2018-08-24 DIAGNOSIS — N390 Urinary tract infection, site not specified: Secondary | ICD-10-CM | POA: Diagnosis not present

## 2018-09-12 ENCOUNTER — Ambulatory Visit: Payer: PPO | Admitting: Urology

## 2018-09-12 ENCOUNTER — Encounter: Payer: Self-pay | Admitting: Urology

## 2018-09-12 VITALS — BP 112/73 | HR 102 | Ht 63.5 in | Wt 251.2 lb

## 2018-09-12 DIAGNOSIS — N3000 Acute cystitis without hematuria: Secondary | ICD-10-CM | POA: Diagnosis not present

## 2018-09-12 DIAGNOSIS — N183 Chronic kidney disease, stage 3 unspecified: Secondary | ICD-10-CM

## 2018-09-12 DIAGNOSIS — N390 Urinary tract infection, site not specified: Secondary | ICD-10-CM | POA: Insufficient documentation

## 2018-09-12 LAB — URINALYSIS, COMPLETE
Bilirubin, UA: NEGATIVE
Glucose, UA: NEGATIVE
KETONES UA: NEGATIVE
LEUKOCYTES UA: NEGATIVE
Nitrite, UA: NEGATIVE
PH UA: 5.5 (ref 5.0–7.5)
Protein, UA: NEGATIVE
RBC, UA: NEGATIVE
SPEC GRAV UA: 1.02 (ref 1.005–1.030)
Urobilinogen, Ur: 0.2 mg/dL (ref 0.2–1.0)

## 2018-09-12 LAB — MICROSCOPIC EXAMINATION
RBC, UA: NONE SEEN /hpf (ref 0–2)
WBC, UA: NONE SEEN /hpf (ref 0–5)

## 2018-09-12 LAB — BLADDER SCAN AMB NON-IMAGING

## 2018-09-12 NOTE — Progress Notes (Signed)
09/12/2018  10:24 AM   Courtney Cherry 11-12-1945 [id number]  Referring provider: Glendon Axe, MD [place] [facility] [place], [place] [address]  Chief Complaint  Patient presents with   Recurrent UTI   Urologic history 1. History of recurrent UTIs  - 3 documented UTIs from 02/22/2018 - 07/14/2018  - Last noted symptom occurrence was 08/24/2018  -Completed antibiotic 12/24; asymptomatic since that time  HPI: Courtney Cherry is a 73 y.o. female that presents today for her initial consultation with concerns regarding her recurring UTIs.   - Reports her symptoms w UTI include bloating, no fevers, chills, nausea, dysuria, and suprapubic pain  - Denies gross hematuria or flank/abdominal pain.  - Previously attempted Augmentin, Keflex, and Cipro for UTIs.  Symptoms resolved however returned after 1 week of antibiotics  - She reports she has been symptom free since 09/02/2018 (last day of her antibiotic treatment)  - Previous UA (08/24/2018) was negative. She believes the results were skewed by her independently starting antibiotic treatment with her previous meds 2 days prior.  - UA today, clear  - History of CKD. She reports concern about her kidney function  PMH: Past Medical History:  Diagnosis Date   Anemia 1951   Arthritis    C. difficile diarrhea    Chronic kidney disease    chronic kidney disease Stage 3    Chronic lower back pain    Colon polyp    family history   DDD (degenerative disc disease), lumbar    Depression    Diabetes mellitus without complication ([place])    Eczema    Fibromyalgia    "went away after I stopped using artificial sweeteners"   Hypercholesteremia    Jaundice    "w/gallstones"   Obesity    Obesity    Osteoarthritis    lumbar spine and knees   Osteopenia    Pneumonia ~ 2002; 1995   Polyp of colon, adenomatous    Pulmonary nodule    Shingles    Shortness of breath    exertion due to back    Urinary urgency    Venous stasis    Vitamin D deficiency    Vitamin D deficiency     Surgical History: Past Surgical History:  Procedure Laterality Date   [facility]; 1999   left screws and plate 1997  and removed [place]     fusion 2008 L4-L5   BACK SURGERY     BREAST BIOPSY Left 2000s   NEG   CATARACT EXTRACTION W/ INTRAOCULAR LENS  IMPLANT, BILATERAL  ~ 2009   CHOLECYSTECTOMY  1966   COLONOSCOPY WITH PROPOFOL N/A 09/05/2016   Procedure: COLONOSCOPY WITH PROPOFOL;  Surgeon: Courtney Silvas, MD;  Location: [facility] ENDOSCOPY;  Service: Endoscopy;  Laterality: N/A;   COLONOSCOPY WITH PROPOFOL N/A 01/29/2018   Procedure: COLONOSCOPY WITH PROPOFOL;  Surgeon: Courtney Silvas, MD;  Location: [facility] ENDOSCOPY;  Service: Endoscopy;  Laterality: N/A;   EYE SURGERY     3 surgeries left 2010,and 1 right eye 2009   FRACTURE SURGERY     JOINT REPLACEMENT Left 12/08/2014   KNEE ARTHROSCOPY  2006   left    LEFT EYE  ?2011   H/O  TORN  IRIS ON LEFT  EYE....AFTER REDO CATARACT...   MICRODISCECTOMY LUMBAR  02/2011   NASAL RECONSTRUCTION WITH SEPTAL REPAIR     OOPHORECTOMY     one  ovary left   ORIF ANKLE FRACTURE     left ankle reconstuction   POSTERIOR FUSION LUMBAR SPINE  02/01/12   TONSILLECTOMY  1953   TOTAL KNEE ARTHROPLASTY Left     Home Medications:  Allergies as of 09/12/2018      Reactions   Sulfa Antibiotics Rash   "gets severe cause I let it go so long"   Adhesive [tape] Other (See Comments)   "pulls my skin; drying; burns a little"   Valium [diazepam] Other (See Comments)   "I get hyper; can't sit still"   Castor Oil Other (See Comments)      Medication List       Accurate as of September 12, 2018 10:24 AM. Always use your most recent med list.        acetaminophen 500 MG tablet Commonly known as:  TYLENOL Take 1,000 mg by mouth every 4 (four) hours as needed. For pain   betamethasone  dipropionate 0.05 % ointment Commonly known as:  DIPROLENE Apply topically 2 (two) times daily.   CALCIUM-MAGNESIUM-VITAMIN D PO Take 1 tablet by mouth 2 (two) times daily.   COENZYME Q-10 PO Take 1 tablet by mouth daily.   ergocalciferol 1.25 MG (50000 UT) capsule Commonly known as:  VITAMIN D2 Take 50,000 Units by mouth daily.   etodolac 500 MG tablet Commonly known as:  LODINE Take 500 mg by mouth 2 (two) times daily.   fish oil-omega-3 fatty acids 1000 MG capsule Take 1 g by mouth daily.   FLORANEX PO Take by mouth every morning.   GLUCOSAMINE CHONDR COMPLEX PO Take 5 tablets by mouth daily.   GREEN COFFEE BEAN PO Take 1 tablet by mouth 2 (two) times daily before a meal.   HYDROcodone-acetaminophen 5-500 MG tablet Commonly known as:  VICODIN Take 1 tablet by mouth every 4 (four) hours as needed. For pain   montelukast 10 MG tablet Commonly known as:  SINGULAIR Take 10 mg by mouth at bedtime.   ondansetron 4 MG tablet Commonly known as:  ZOFRAN Take 4 mg by mouth every 8 (eight) hours as needed for nausea or vomiting.   OVER THE COUNTER MEDICATION Take 1 tablet by mouth daily. "Solar #7" herbal pain medication   PROBIOTIC ADVANCED PO Take by mouth.   TIGER BALM PAIN RELIEVING 80-24-16 MG Ptch Generic drug:  Camphor-Menthol-Capsicum Apply 1 patch topically every morning.   traMADol 50 MG tablet Commonly known as:  ULTRAM Take 100 mg by mouth at bedtime as needed. For pain   VITAMIN D3 PO Take by mouth.       Allergies:  Allergies  Allergen Reactions   Sulfa Antibiotics Rash    "gets severe cause I let it go so long"   Adhesive [Tape] Other (See Comments)    "pulls my skin; drying; burns a little"   Valium [Diazepam] Other (See Comments)    "I get hyper; can't sit still"   Castor Oil Other (See Comments)    Family History: Family History  Problem Relation Age of Onset   Breast cancer Paternal Grandmother 89       and 28    Anesthesia problems Neg Hx    Hypotension Neg Hx    Malignant hyperthermia Neg Hx    Pseudochol deficiency Neg Hx     Social History:  reports that she has never smoked. She has never used smokeless tobacco. She reports that she does not drink alcohol or use drugs.  ROS: UROLOGY Frequent Urination?:  Yes Hard to postpone urination?: No Burning/pain with urination?: No Get up at night to urinate?: Yes Leakage of urine?: No Urine stream starts and stops?: No Trouble starting stream?: No Do you have to strain to urinate?: No Blood in urine?: No Urinary tract infection?: Yes Sexually transmitted disease?: No Injury to kidneys or bladder?: No Painful intercourse?: Yes Weak stream?: No Currently pregnant?: No Vaginal bleeding?: No Last menstrual period?: Hysterectomy  Gastrointestinal Nausea?: No Vomiting?: No Indigestion/heartburn?: No Diarrhea?: No Constipation?: No  Constitutional Fever: No Night sweats?: No Weight loss?: No Fatigue?: Yes  Skin Skin rash/lesions?: No Itching?: Yes  Eyes Blurred vision?: No Double vision?: No  Ears/Nose/Throat Sore throat?: No Sinus problems?: Yes  Hematologic/Lymphatic Swollen glands?: No Easy bruising?: No  Cardiovascular Leg swelling?: No Chest pain?: No  Respiratory Cough?: No Shortness of breath?: No  Endocrine Excessive thirst?: No  Musculoskeletal Back pain?: Yes Joint pain?: Yes  Neurological Headaches?: No Dizziness?: No  Psychologic Depression?: No Anxiety?: No  Physical Exam: BP 112/73 (BP Location: Left Arm, Patient Position: Sitting, Cuff Size: Large)    Pulse (!) 102    Ht 5' 3.5" (1.613 m)    Wt 251 lb 3.2 oz (113.9 kg)    BMI 43.80 kg/m   Constitutional:  Alert and oriented, No acute distress. Respiratory: Normal respiratory effort, no increased work of breathing. GI: Abdomen is soft, nontender, nondistended, no abdominal masses GU: No CVA tenderness Skin: No rashes, bruises or  suspicious lesions. Neurologic: Grossly intact, no focal deficits, moving all 4 extremities. Psychiatric: Normal mood and affect.  Urinalysis UA today was negative.  Laboratory Data: Lab Results  Component Value Date   WBC 7.8 06/11/2014   HGB 11.8 (L) 12/10/2014   HCT 41.4 06/11/2014   MCV 90 06/11/2014   PLT 188 12/10/2014   Lab Results  Component Value Date   CREATININE 0.84 12/10/2014   Pertinent Imaging: Results for orders placed during the hospital encounter of 10/31/16  US Renal   Narrative CLINICAL DATA:  Chronic renal disease.  EXAM: RENAL / URINARY TRACT ULTRASOUND COMPLETE  COMPARISON:  CT 04/11/2016.  FINDINGS: Right Kidney:  Length: 11.4 cm. Mild renal cortical irregularity. Echogenicity within normal limits. No mass or hydronephrosis visualized.  Left Kidney:  Length: 12.2 cm. Mild renal cortical irregularity. Echogenicity within normal limits. No mass or hydronephrosis visualized.  Bladder:  Appears normal for degree of bladder distention.  IMPRESSION: Mild bilateral renal cortical irregularity suggesting scarring. No acute abnormality. No hydronephrosis. No bladder distention.   Electronically Signed   By: Marcello Moores  Register   On: 10/31/2016 15:04    Assessment & Plan:   1. Recurrent UTIs  - UA today was negative  - Explained that UTIs are common among post menopausal women  - Discussed possibilities of her UTIs being caused for a more difficult strain of bacteria that was not properly treated. Keflex is meant to be taked 4x daily to be effective, but was told to take it 3x. Other antibiotics treatment plans  - As pt is symptom free and UA was clear, I recommend holding off on cyso.  - Recommended practicing good pereneal hygeine and implementing cranberry capsules. Explained that they serve as a preventative measure.  - Recommend surveillance and instructed patient call if/when she experiences recurrent UTI symtpoms. If symptoms reoccur, we  will schedule a cysto  2. CKD  - She requested to see a nephrologist for her kidney concerns. Referred order placed.   Stoioff, Ronda Fairly, MD  [place] [address], [place], [place] [address] [phone]  I, Stephania Fragmin , am acting as a scribe for [employer], MD  I, Abbie Sons, MD, have reviewed all documentation for this visit. The documentation on 09/12/18 for the exam, diagnosis, procedures, and orders are all accurate and complete.

## 2018-10-01 ENCOUNTER — Ambulatory Visit (INDEPENDENT_AMBULATORY_CARE_PROVIDER_SITE_OTHER): Payer: PPO | Admitting: Urology

## 2018-10-01 DIAGNOSIS — R110 Nausea: Secondary | ICD-10-CM | POA: Diagnosis not present

## 2018-10-01 DIAGNOSIS — N390 Urinary tract infection, site not specified: Secondary | ICD-10-CM

## 2018-10-01 DIAGNOSIS — R350 Frequency of micturition: Secondary | ICD-10-CM | POA: Diagnosis not present

## 2018-10-01 LAB — URINALYSIS, COMPLETE
Bilirubin, UA: NEGATIVE
GLUCOSE, UA: NEGATIVE
Ketones, UA: NEGATIVE
NITRITE UA: NEGATIVE
Specific Gravity, UA: 1.025 (ref 1.005–1.030)
Urobilinogen, Ur: 0.2 mg/dL (ref 0.2–1.0)
pH, UA: 6 (ref 5.0–7.5)

## 2018-10-01 LAB — MICROSCOPIC EXAMINATION: WBC, UA: >30 /hpf — ABNORMAL HIGH (ref 0–5)

## 2018-10-01 MED ORDER — CEFUROXIME AXETIL 250 MG PO TABS: 250.0000 mg | ORAL_TABLET | Freq: Two times a day (BID) | ORAL | 0 refills | Status: DC

## 2018-10-01 NOTE — Progress Notes (Signed)
Patient presents to the office today to be tested for urinary tract infection.  She is currently experiencing:urinary frequency, nausea, back/flank pain, lower abdominal pain/bloating.  Patient denies fever.  Patient has been taking cranberry capsules.  Advised patient that we will send urine for culture.  Reviewed urinalysis with Dr Bernardo Heater and advises starting patient on Ceftin 250 BID until culture come back.

## 2018-10-06 ENCOUNTER — Telehealth: Payer: Self-pay

## 2018-10-06 LAB — CULTURE, URINE COMPREHENSIVE

## 2018-10-06 MED ORDER — DOXYCYCLINE HYCLATE 100 MG PO CAPS: 100.0000 mg | ORAL_CAPSULE | Freq: Two times a day (BID) | ORAL | 0 refills | Status: AC

## 2018-10-06 MED ORDER — DOXYCYCLINE HYCLATE 100 MG PO CAPS: 100.0000 mg | ORAL_CAPSULE | Freq: Two times a day (BID) | ORAL | 0 refills | Status: DC

## 2018-10-06 NOTE — Telephone Encounter (Signed)
Called and advised patient to stop Ceftin and start Doxy. Doxycycline sent to [facility]. Advised patient to contact office if symptoms return or worsen after new Rx. Patient verbalized understanding.

## 2018-10-06 NOTE — Telephone Encounter (Signed)
-----   Message from Abbie Sons, MD sent at 10/06/2018  9:50 AM EST ----- Urine culture was positive but was resistant to Ceftin.  Discontinue Ceftin.  Please send in Rx doxycycline 100 mg twice daily x7 days.

## 2018-11-03 DIAGNOSIS — N390 Urinary tract infection, site not specified: Secondary | ICD-10-CM | POA: Diagnosis not present

## 2018-11-03 DIAGNOSIS — E1129 Type 2 diabetes mellitus with other diabetic kidney complication: Secondary | ICD-10-CM | POA: Diagnosis not present

## 2018-11-03 DIAGNOSIS — R809 Proteinuria, unspecified: Secondary | ICD-10-CM | POA: Diagnosis not present

## 2018-11-03 DIAGNOSIS — E119 Type 2 diabetes mellitus without complications: Secondary | ICD-10-CM | POA: Diagnosis not present

## 2018-11-03 DIAGNOSIS — N183 Chronic kidney disease, stage 3 (moderate): Secondary | ICD-10-CM | POA: Diagnosis not present

## 2018-11-13 ENCOUNTER — Other Ambulatory Visit: Payer: Self-pay

## 2018-12-17 ENCOUNTER — Other Ambulatory Visit: Payer: Self-pay | Admitting: Internal Medicine

## 2018-12-17 DIAGNOSIS — Z1231 Encounter for screening mammogram for malignant neoplasm of breast: Secondary | ICD-10-CM

## 2018-12-23 ENCOUNTER — Other Ambulatory Visit: Payer: Self-pay

## 2018-12-23 ENCOUNTER — Encounter: Payer: Self-pay | Admitting: Family Medicine

## 2018-12-23 ENCOUNTER — Telehealth: Payer: Self-pay

## 2018-12-23 ENCOUNTER — Ambulatory Visit (INDEPENDENT_AMBULATORY_CARE_PROVIDER_SITE_OTHER): Payer: PPO | Admitting: Family Medicine

## 2018-12-23 VITALS — BP 127/83 | HR 83 | Temp 98.3°F | Ht 63.5 in | Wt 252.0 lb

## 2018-12-23 DIAGNOSIS — E559 Vitamin D deficiency, unspecified: Secondary | ICD-10-CM

## 2018-12-23 DIAGNOSIS — J301 Allergic rhinitis due to pollen: Secondary | ICD-10-CM

## 2018-12-23 DIAGNOSIS — N183 Chronic kidney disease, stage 3 unspecified: Secondary | ICD-10-CM

## 2018-12-23 DIAGNOSIS — M5136 Other intervertebral disc degeneration, lumbar region: Secondary | ICD-10-CM

## 2018-12-23 DIAGNOSIS — M19012 Primary osteoarthritis, left shoulder: Secondary | ICD-10-CM | POA: Diagnosis not present

## 2018-12-23 DIAGNOSIS — E7800 Pure hypercholesterolemia, unspecified: Secondary | ICD-10-CM

## 2018-12-23 DIAGNOSIS — Z1159 Encounter for screening for other viral diseases: Secondary | ICD-10-CM

## 2018-12-23 DIAGNOSIS — M25512 Pain in left shoulder: Secondary | ICD-10-CM | POA: Diagnosis not present

## 2018-12-23 DIAGNOSIS — Z114 Encounter for screening for human immunodeficiency virus [HIV]: Secondary | ICD-10-CM

## 2018-12-23 DIAGNOSIS — E119 Type 2 diabetes mellitus without complications: Secondary | ICD-10-CM | POA: Diagnosis not present

## 2018-12-23 DIAGNOSIS — R7303 Prediabetes: Secondary | ICD-10-CM

## 2018-12-23 DIAGNOSIS — N644 Mastodynia: Secondary | ICD-10-CM | POA: Diagnosis not present

## 2018-12-23 DIAGNOSIS — Z23 Encounter for immunization: Secondary | ICD-10-CM

## 2018-12-23 DIAGNOSIS — N390 Urinary tract infection, site not specified: Secondary | ICD-10-CM

## 2018-12-23 DIAGNOSIS — Z6841 Body Mass Index (BMI) 40.0 and over, adult: Secondary | ICD-10-CM

## 2018-12-23 DIAGNOSIS — M51369 Other intervertebral disc degeneration, lumbar region without mention of lumbar back pain or lower extremity pain: Secondary | ICD-10-CM

## 2018-12-23 MED ORDER — MONTELUKAST SODIUM 10 MG PO TABS: 10.0000 mg | ORAL_TABLET | Freq: Every day | ORAL | 1 refills | Status: DC

## 2018-12-23 NOTE — Assessment & Plan Note (Signed)
Will check labs. Await results. Call with any concerns. Treat as needed. Last check was 6.0- prediabetic not diabetic.

## 2018-12-23 NOTE — Assessment & Plan Note (Signed)
Encouraged continued diet and exercise. Call with any concerns. Weight loss of 1-2 lbs per week.

## 2018-12-23 NOTE — Patient Instructions (Signed)

## 2018-12-23 NOTE — Assessment & Plan Note (Signed)
Doing well with aqua-aerobics. Continue to monitor. Call with any concerns.

## 2018-12-23 NOTE — Assessment & Plan Note (Signed)
Continue to follow with urology. Call with any concerns. Continue to monitor.  

## 2018-12-23 NOTE — Addendum Note (Signed)
Addended by: Gerda Diss A on: 12/23/2018 03:32 PM   Modules accepted: Orders

## 2018-12-23 NOTE — Assessment & Plan Note (Signed)
Only on fish oil right now. Will check labs. Treat as needed. Call with any concerns.

## 2018-12-23 NOTE — Progress Notes (Signed)
BP 127/83   Pulse 83   Temp 98.3 F (36.8 C)   Ht 5' 3.5" (1.613 m)   Wt 252 lb (114.3 kg)   SpO2 97%   BMI 43.94 kg/m    Subjective:    Patient ID: Courtney Cherry, female    DOB: 24-Jun-1946, 73 y.o.   MRN: [id number]  HPI: Courtney Cherry is a 73 y.o. female  Chief Complaint  Patient presents with  . New Patient (Initial Visit)   Has been having some discomfort in the RUQ of her L breast/Chest has been going on for about 6 months. It's occasionally sharp, usually more pressure or dull pain. Nothing seems to make it better or worse. Doesn't seem to come on with exercise.  No SOB, no sweats, no radiation.   PRE-DIABETES Hypoglycemic episodes:no Polydipsia/polyuria: no Visual disturbance: no Chest pain: yes Paresthesias: no Glucose Monitoring: no  Accucheck frequency: Not Checking Taking Insulin?: no Blood Pressure Monitoring: not checking Retinal Examination: Not up to Date Foot Exam: Up to Date Diabetic Education: Completed Pneumovax: Up to Date Influenza: Up to Date Aspirin: no  HYPERLIPIDEMIA Hyperlipidemia status: excellent compliance Satisfied with current treatment?  no Side effects:  no Medication compliance: excellent- just on fish oil Past cholesterol meds: fish oil Supplements: fish oil Aspirin:  yes The ASCVD Risk score Mikey Bussing DC Jr., et al., 2013) failed to calculate for the following reasons:   Cannot find a previous HDL lab   Cannot find a previous total cholesterol lab Chest pain:  Yes- breast pain Coronary artery disease:  no   Active Ambulatory Problems    Diagnosis Date Noted  . CKD (chronic kidney disease) stage 3, GFR 30-59 ml/min (HCC) 12/05/2015  . FH: colon polyps 06/12/2016  . Hx of adenomatous polyp of colon 10/25/2017  . Hx of Clostridium difficile infection 06/12/2016  . Incidental pulmonary nodule, > 73mm and < 54mm 06/15/2014  . Intermittent vertigo 06/17/2017  . Lumbar degenerative disc disease 12/05/2015  . Morbid obesity  with BMI of 40.0-44.9, adult ([place]) 04/24/2017  . Osteopenia 12/05/2015  . Prediabetes 04/02/2014  . Presence of left artificial knee joint 12/22/2014  . Primary osteoarthritis of left knee 05/11/2014  . Pure hypercholesterolemia 03/22/2017  . Seasonal allergic rhinitis 06/17/2017  . Type 2 diabetes mellitus without complication ([place]) 65/78/4696  . Vitamin D deficiency 04/02/2014  . Post-menopause atrophic vaginitis 04/02/2018  . Recurrent UTI 09/12/2018   Resolved Ambulatory Problems    Diagnosis Date Noted  . Chronic low back pain 04/02/2014  . History of vitamin D deficiency 12/05/2015   Past Medical History:  Diagnosis Date  . Anemia 1951  . Arthritis   . C. difficile diarrhea   . Chronic kidney disease   . Chronic lower back pain   . Colon polyp   . DDD (degenerative disc disease), lumbar   . Depression   . Diabetes mellitus without complication ([place])   . Eczema   . Fibromyalgia   . Hypercholesteremia   . Jaundice   . Obesity   . Obesity   . Osteoarthritis   . Pneumonia ~ 2002; 1995  . Polyp of colon, adenomatous   . Pulmonary nodule   . Shingles   . Shortness of breath   . Urinary urgency   . Venous stasis    Past Surgical History:  Procedure Laterality Date  . ABDOMINAL HYSTERECTOMY  1974  . [place]; 1999   left screws and plate 1997  and  removed 1999  . BACK SURGERY     fusion 2008 L4-L5  . BACK SURGERY    . BREAST BIOPSY Left 2000s   NEG  . CATARACT EXTRACTION W/ INTRAOCULAR LENS  IMPLANT, BILATERAL  ~ 2009  . CHOLECYSTECTOMY  1966  . COLONOSCOPY WITH PROPOFOL N/A 09/05/2016   Procedure: COLONOSCOPY WITH PROPOFOL;  Surgeon: Manya Silvas, MD;  Location: [facility] ENDOSCOPY;  Service: Endoscopy;  Laterality: N/A;  . COLONOSCOPY WITH PROPOFOL N/A 01/29/2018   Procedure: COLONOSCOPY WITH PROPOFOL;  Surgeon: Manya Silvas, MD;  Location[facility] ENDOSCOPY;  Service: Endoscopy;  Laterality: N/A;  . EYE SURGERY     3 surgeries left  2010,and 1 right eye 2009  . FRACTURE SURGERY    . JOINT REPLACEMENT Left 12/08/2014  . KNEE ARTHROSCOPY  2006   left   . 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  . MICRODISCECTOMY LUMBAR  02/2011  . NASAL RECONSTRUCTION WITH SEPTAL REPAIR    . OOPHORECTOMY     one ovary left  . ORIF ANKLE FRACTURE     left ankle reconstuction  . POSTERIOR FUSION LUMBAR SPINE  02/01/12  . TONSILLECTOMY  1953  . TOTAL KNEE ARTHROPLASTY Left    Outpatient Encounter Medications as of 12/23/2018  Medication Sig Note  . Cholecalciferol (VITAMIN D3 PO) Take by mouth.   . montelukast (SINGULAIR) 10 MG tablet Take 1 tablet (10 mg total) by mouth at bedtime.   . Probiotic Product (PROBIOTIC ADVANCED PO) Take by mouth.   . [DISCONTINUED] montelukast (SINGULAIR) 10 MG tablet Take 10 mg by mouth at bedtime.   Marland Kitchen acetaminophen (TYLENOL) 500 MG tablet Take 1,000 mg by mouth every 4 (four) hours as needed. For pain   . betamethasone dipropionate (DIPROLENE) 0.05 % ointment Apply topically 2 (two) times daily.   Marland Kitchen CALCIUM-MAGNESIUM-VITAMIN D PO Take 1 tablet by mouth 2 (two) times daily.   . Camphor-Menthol-Capsicum (TIGER BALM PAIN RELIEVING) 80-24-16 MG PTCH Apply 1 patch topically every morning. 01/02/2012: Hold while in hospital   . COENZYME Q-10 PO Take 1 tablet by mouth daily.   . ergocalciferol (VITAMIN D2) 50000 units capsule Take 50,000 Units by mouth daily.   Marland Kitchen etodolac (LODINE) 500 MG tablet Take 500 mg by mouth 2 (two) times daily.   . fish oil-omega-3 fatty acids 1000 MG capsule Take 1 g by mouth daily.   . Glucosamine-Chondroitin (GLUCOSAMINE CHONDR COMPLEX PO) Take 5 tablets by mouth daily. 01/02/2012: Hold while in hospital   . GREEN COFFEE BEAN PO Take 1 tablet by mouth 2 (two) times daily before a meal. 01/02/2012: Hold while in hospital   . Lactobacillus (FLORANEX PO) Take by mouth every morning.   Marland Kitchen OVER THE COUNTER MEDICATION Take 1 tablet by mouth daily. "Solar #7"  herbal pain medication 01/02/2012: Hold while in hospital   . [DISCONTINUED] cefUROXime (CEFTIN) 250 MG tablet Take 1 tablet (250 mg total) by mouth 2 (two) times daily with a meal. (Patient not taking: Reported on 12/23/2018)   . [DISCONTINUED] HYDROcodone-acetaminophen (VICODIN) 5-500 MG per tablet Take 1 tablet by mouth every 4 (four) hours as needed. For pain   . [DISCONTINUED] ondansetron (ZOFRAN) 4 MG tablet Take 4 mg by mouth every 8 (eight) hours as needed for nausea or vomiting.   . [DISCONTINUED] traMADol (ULTRAM) 50 MG tablet Take 100 mg by mouth at bedtime as needed. For pain    No facility-administered encounter medications  on file as of 12/23/2018.    Allergies  Allergen Reactions  . Sulfa Antibiotics Rash    "gets severe cause I let it go so long"  . Adhesive [Tape] Other (See Comments)    "pulls my skin; drying; burns a little"  . Valium [Diazepam] Other (See Comments)    "I get hyper; can't sit still"  . Castor Oil Other (See Comments)   Social History   Socioeconomic History  . Marital status: Married    Spouse name: Not on file  . Number of children: Not on file  . Years of education: Not on file  . Highest education level: Not on file  Occupational History  . Occupation: Retired  Scientific laboratory technician  . Financial resource strain: Not on file  . Food insecurity:    Worry: Not on file    Inability: Not on file  . Transportation needs:    Medical: Not on file    Non-medical: Not on file  Tobacco Use  . Smoking status: Never Smoker  . Smokeless tobacco: Never Used  Substance and Sexual Activity  . Alcohol use: No  . Drug use: No  . Sexual activity: Not Currently  Lifestyle  . Physical activity:    Days per week: Not on file    Minutes per session: Not on file  . Stress: Not on file  Relationships  . Social connections:    Talks on phone: Not on file    Gets together: Not on file    Attends religious service: Not on file    Active member of club or  organization: Not on file    Attends meetings of clubs or organizations: Not on file    Relationship status: Not on file  Other Topics Concern  . Not on file  Social History Narrative  . Not on file   Family History  Problem Relation Age of Onset  . Breast cancer Paternal Grandmother 37       and 30  . Cystic fibrosis Mother   . Stroke Mother   . Asthma Father   . Anesthesia problems Neg Hx   . Hypotension Neg Hx   . Malignant hyperthermia Neg Hx   . Pseudochol deficiency Neg Hx     Review of Systems  Constitutional: Negative.   Respiratory: Negative.   Cardiovascular: Positive for chest pain. Negative for palpitations and leg swelling.  Gastrointestinal: Negative.   Musculoskeletal: Negative.   Neurological: Negative.   Psychiatric/Behavioral: Negative.     Per HPI unless specifically indicated above     Objective:    BP 127/83   Pulse 83   Temp 98.3 F (36.8 C)   Ht 5' 3.5" (1.613 m)   Wt 252 lb (114.3 kg)   SpO2 97%   BMI 43.94 kg/m   Wt Readings from Last 3 Encounters:  12/23/18 252 lb (114.3 kg)  09/12/18 251 lb 3.2 oz (113.9 kg)  01/29/18 237 lb (107.5 kg)    Physical Exam Vitals signs and nursing note reviewed.  Constitutional:      General: She is not in acute distress.    Appearance: Normal appearance. She is not ill-appearing, toxic-appearing or diaphoretic.  HENT:     Head: Normocephalic and atraumatic.     Right Ear: External ear normal.     Left Ear: External ear normal.     Nose: Nose normal.     Mouth/Throat:     Mouth: Mucous membranes are moist.     Pharynx:  Oropharynx is clear.  Eyes:     General: No scleral icterus.       Right eye: No discharge.        Left eye: No discharge.     Extraocular Movements: Extraocular movements intact.     Conjunctiva/sclera: Conjunctivae normal.     Pupils: Pupils are equal, round, and reactive to light.  Neck:     Musculoskeletal: Normal range of motion and neck supple.  Cardiovascular:      Rate and Rhythm: Normal rate and regular rhythm.     Pulses: Normal pulses.     Heart sounds: Normal heart sounds. No murmur. No friction rub. No gallop.   Pulmonary:     Effort: Pulmonary effort is normal. No respiratory distress.     Breath sounds: Normal breath sounds. No stridor. No wheezing, rhonchi or rales.  Chest:     Chest wall: No tenderness.    Musculoskeletal: Normal range of motion.  Skin:    General: Skin is warm and dry.     Capillary Refill: Capillary refill takes less than 2 seconds.     Coloration: Skin is not jaundiced or pale.     Findings: No bruising, erythema, lesion or rash.  Neurological:     General: No focal deficit present.     Mental Status: She is alert and oriented to person, place, and time. Mental status is at baseline.  Psychiatric:        Mood and Affect: Mood normal.        Behavior: Behavior normal.        Thought Content: Thought content normal.        Judgment: Judgment normal.     Results for orders placed or performed in visit on 10/01/18  CULTURE, URINE COMPREHENSIVE  Result Value Ref Range   Urine Culture, Comprehensive Final report (A)    Organism ID, Bacteria Citrobacter youngae (A)    Organism ID, Bacteria Comment    ANTIMICROBIAL SUSCEPTIBILITY Comment   Microscopic Examination  Result Value Ref Range   WBC, UA >30 (H) 0 - 5 /hpf   RBC, UA 0-2 0 - 2 /hpf   Epithelial Cells (non renal) 0-10 0 - 10 /hpf   Mucus, UA Present (A) Not Estab.   Bacteria, UA Many (A) None seen/Few  Urinalysis, Complete  Result Value Ref Range   Specific Gravity, UA 1.025 1.005 - 1.030   pH, UA 6.0 5.0 - 7.5   Color, UA Yellow Yellow   Appearance Ur Cloudy (A) Clear   Leukocytes, UA 3+ (A) Negative   Protein, UA Trace (A) Negative/Trace   Glucose, UA Negative Negative   Ketones, UA Negative Negative   RBC, UA 1+ (A) Negative   Bilirubin, UA Negative Negative   Urobilinogen, Ur 0.2 0.2 - 1.0 mg/dL   Nitrite, UA Negative Negative   Microscopic  Examination See below:       Assessment & Plan:   Problem List Items Addressed This Visit      Respiratory   Seasonal allergic rhinitis    Stable on singulair. Call with any concerns. Refills given today.        Endocrine   Type 2 diabetes mellitus without complication ([place]) - Primary    Will check labs. Await results. Call with any concerns. Treat as needed. Last check was 6.0- prediabetic not diabetic.       Relevant Orders   Bayer DCA Hb A1c Waived   CBC with Differential/Platelet   Comprehensive  metabolic panel   Lipid Panel w/o Chol/HDL Ratio   Microalbumin, Urine Waived   TSH   Pneumococcal polysaccharide vaccine 23-valent greater than or equal to 2yo subcutaneous/IM (Completed)     Musculoskeletal and Integument   Lumbar degenerative disc disease    Doing well with aqua-aerobics. Continue to monitor. Call with any concerns.         Genitourinary   CKD (chronic kidney disease) stage 3, GFR 30-59 ml/min (HCC)    Checking labs today. Await results. Call with any concerns. Continue to monitor.       Recurrent UTI    Continue to follow with urology. Call with any concerns. Continue to monitor.       Relevant Orders   CBC with Differential/Platelet   Comprehensive metabolic panel   TSH   UA/M w/rflx Culture, Routine     Other   Morbid obesity with BMI of 40.0-44.9, adult ([place])    Encouraged continued diet and exercise. Call with any concerns. Weight loss of 1-2 lbs per week.       Prediabetes    Will check labs. Await results. Call with any concerns. Treat as needed. Last check was 6.0- prediabetic not diabetic.       Pure hypercholesterolemia    Only on fish oil right now. Will check labs. Treat as needed. Call with any concerns.       Relevant Orders   CBC with Differential/Platelet   Comprehensive metabolic panel   TSH   Vitamin D deficiency    Checking labs today. Await results. Call with any concerns. Continue to monitor.       Relevant  Orders   CBC with Differential/Platelet   Comprehensive metabolic panel   TSH   VITAMIN D 25 Hydroxy (Vit-D Deficiency, Fractures)    Other Visit Diagnoses    Screening for HIV without presence of risk factors       Checking labs today. Await results. Call with any concerns. Continue to monitor.    Relevant Orders   HIV Antibody (routine testing w rflx)   Encounter for hepatitis C screening test for low risk patient       Checking labs today. Await results. Call with any concerns. Continue to monitor.    Relevant Orders   Hepatitis C Antibody   Breast pain       Will get diagnostic mammogram. Order placed. Call with any concerns.    Relevant Orders   MM Digital Diagnostic Unilat L   US BREAST COMPLETE UNI LEFT INC AXILLA       Follow up plan: Return in about 3 months (around 03/24/2019) for Wellness/Physical.

## 2018-12-23 NOTE — Assessment & Plan Note (Signed)
Stable on singulair. Call with any concerns. Refills given today.

## 2018-12-23 NOTE — Assessment & Plan Note (Signed)
Checking labs today. Await results. Call with any concerns. Continue to monitor.  

## 2018-12-23 NOTE — Telephone Encounter (Signed)
Called patient to notify her that we were able to get her mammogram appointment moved up. It is scheduled for 12/29/2018 @ 1:40. She will need to use the [facility] entrance at [facility]. Due to [facility] she will not be able to have anyone enter the hospital with her.   Left message on machine for pt to return call to the office.

## 2018-12-24 ENCOUNTER — Encounter: Payer: Self-pay | Admitting: Family Medicine

## 2018-12-24 LAB — CBC WITH DIFFERENTIAL/PLATELET
Basophils Absolute: 0.1 10*3/uL (ref 0.0–0.2)
Basos: 2 %
EOS (ABSOLUTE): 0.3 10*3/uL (ref 0.0–0.4)
Eos: 3 %
Hematocrit: 42.8 % (ref 34.0–46.6)
Hemoglobin: 14.2 g/dL (ref 11.1–15.9)
Immature Grans (Abs): 0 10*3/uL (ref 0.0–0.1)
Immature Granulocytes: 0 %
Lymphocytes Absolute: 2.4 10*3/uL (ref 0.7–3.1)
Lymphs: 28 %
MCH: 29.3 pg (ref 26.6–33.0)
MCHC: 33.2 g/dL (ref 31.5–35.7)
MCV: 88 fL (ref 79–97)
Monocytes Absolute: 0.7 10*3/uL (ref 0.1–0.9)
Monocytes: 8 %
Neutrophils Absolute: 5.1 10*3/uL (ref 1.4–7.0)
Neutrophils: 59 %
Platelets: 275 10*3/uL (ref 150–450)
RBC: 4.85 x10E6/uL (ref 3.77–5.28)
RDW: 13.2 % (ref 11.7–15.4)
WBC: 8.6 10*3/uL (ref 3.4–10.8)

## 2018-12-24 LAB — COMPREHENSIVE METABOLIC PANEL
ALT: 31 IU/L (ref 0–32)
AST: 33 IU/L (ref 0–40)
Albumin/Globulin Ratio: 1.4 (ref 1.2–2.2)
Albumin: 4.1 g/dL (ref 3.7–4.7)
Alkaline Phosphatase: 63 IU/L (ref 39–117)
BUN/Creatinine Ratio: 13 (ref 12–28)
BUN: 14 mg/dL (ref 8–27)
Bilirubin Total: 0.4 mg/dL (ref 0.0–1.2)
CO2: 19 mmol/L — ABNORMAL LOW (ref 20–29)
Calcium: 9.8 mg/dL (ref 8.7–10.3)
Chloride: 104 mmol/L (ref 96–106)
Creatinine, Ser: 1.05 mg/dL — ABNORMAL HIGH (ref 0.57–1.00)
GFR calc Af Amer: 61 mL/min/{1.73_m2} (ref >59)
GFR calc non Af Amer: 53 mL/min/{1.73_m2} — ABNORMAL LOW (ref >59)
Globulin, Total: 2.9 g/dL (ref 1.5–4.5)
Glucose: 103 mg/dL — ABNORMAL HIGH (ref 65–99)
Potassium: 4.8 mmol/L (ref 3.5–5.2)
Sodium: 141 mmol/L (ref 134–144)
Total Protein: 7 g/dL (ref 6.0–8.5)

## 2018-12-24 LAB — HIV ANTIBODY (ROUTINE TESTING W REFLEX): HIV Screen 4th Generation wRfx: NONREACTIVE

## 2018-12-24 LAB — TSH: TSH: 3.33 u[IU]/mL (ref 0.450–4.500)

## 2018-12-24 LAB — LIPID PANEL W/O CHOL/HDL RATIO
Cholesterol, Total: 221 mg/dL — ABNORMAL HIGH (ref 100–199)
HDL: 50 mg/dL (ref >39)
LDL Calculated: 140 mg/dL — ABNORMAL HIGH (ref 0–99)
Triglycerides: 157 mg/dL — ABNORMAL HIGH (ref 0–149)
VLDL Cholesterol Cal: 31 mg/dL (ref 5–40)

## 2018-12-24 LAB — HEPATITIS C ANTIBODY: Hep C Virus Ab: <0.1 s/co ratio (ref 0.0–0.9)

## 2018-12-24 LAB — VITAMIN D 25 HYDROXY (VIT D DEFICIENCY, FRACTURES): Vit D, 25-Hydroxy: 42.2 ng/mL (ref 30.0–100.0)

## 2018-12-24 NOTE — Telephone Encounter (Signed)
Patient notified of new appt time and instructions from [place].

## 2018-12-25 LAB — URINE CULTURE, REFLEX

## 2018-12-25 LAB — UA/M W/RFLX CULTURE, ROUTINE
Bilirubin, UA: NEGATIVE
Glucose, UA: NEGATIVE
Ketones, UA: NEGATIVE
Nitrite, UA: NEGATIVE
Protein,UA: NEGATIVE
Specific Gravity, UA: 1.02 (ref 1.005–1.030)
Urobilinogen, Ur: 0.2 mg/dL (ref 0.2–1.0)
pH, UA: 5.5 (ref 5.0–7.5)

## 2018-12-25 LAB — MICROSCOPIC EXAMINATION

## 2018-12-25 LAB — BAYER DCA HB A1C WAIVED: HB A1C (BAYER DCA - WAIVED): 6.2 % (ref <7.0)

## 2018-12-25 LAB — MICROALBUMIN, URINE WAIVED
Creatinine, Urine Waived: 100 mg/dL (ref 10–300)
Microalb, Ur Waived: 10 mg/L (ref 0–19)
Microalb/Creat Ratio: <30 mg/g (ref <30)

## 2018-12-26 ENCOUNTER — Telehealth: Payer: Self-pay | Admitting: Family Medicine

## 2018-12-26 MED ORDER — NITROFURANTOIN MONOHYD MACRO 100 MG PO CAPS: 100.0000 mg | ORAL_CAPSULE | Freq: Two times a day (BID) | ORAL | 0 refills | Status: DC

## 2018-12-26 NOTE — Telephone Encounter (Signed)
Patient notified

## 2018-12-26 NOTE — Telephone Encounter (Signed)
Please let her know that her labs came back normal and there's a letter in the mail for her, but she did grow out some bacteria in her urine and it looks like she has a little UTI. I've sent some medicine over to her pharmacy that should get rid of it. Thanks!

## 2018-12-29 ENCOUNTER — Ambulatory Visit
Admission: RE | Admit: 2018-12-29 | Discharge: 2018-12-29 | Disposition: A | Discharge status: Home / Self Care | Payer: PPO | Source: Ambulatory Visit | Attending: Family Medicine | Admitting: Family Medicine

## 2018-12-29 ENCOUNTER — Other Ambulatory Visit: Payer: Self-pay

## 2018-12-29 ENCOUNTER — Encounter: Payer: Self-pay | Admitting: Family Medicine

## 2018-12-29 DIAGNOSIS — N644 Mastodynia: Secondary | ICD-10-CM | POA: Diagnosis not present

## 2018-12-29 DIAGNOSIS — R928 Other abnormal and inconclusive findings on diagnostic imaging of breast: Secondary | ICD-10-CM | POA: Diagnosis not present

## 2019-02-10 ENCOUNTER — Other Ambulatory Visit: Payer: Self-pay

## 2019-02-10 ENCOUNTER — Ambulatory Visit
Admission: RE | Admit: 2019-02-10 | Discharge: 2019-02-10 | Disposition: A | Discharge status: Home / Self Care | Payer: PPO | Source: Ambulatory Visit | Attending: Internal Medicine | Admitting: Internal Medicine

## 2019-02-10 DIAGNOSIS — Z1231 Encounter for screening mammogram for malignant neoplasm of breast: Secondary | ICD-10-CM | POA: Diagnosis not present

## 2019-02-11 DIAGNOSIS — Q132 Other congenital malformations of iris: Secondary | ICD-10-CM | POA: Diagnosis not present

## 2019-02-11 DIAGNOSIS — H5371 Glare sensitivity: Secondary | ICD-10-CM | POA: Diagnosis not present

## 2019-03-25 ENCOUNTER — Ambulatory Visit: Payer: PPO | Admitting: Family Medicine

## 2019-03-26 ENCOUNTER — Ambulatory Visit (INDEPENDENT_AMBULATORY_CARE_PROVIDER_SITE_OTHER): Payer: PPO | Admitting: Family Medicine

## 2019-03-26 ENCOUNTER — Other Ambulatory Visit: Payer: Self-pay

## 2019-03-26 ENCOUNTER — Encounter: Payer: Self-pay | Admitting: Family Medicine

## 2019-03-26 VITALS — BP 117/74 | HR 80 | Temp 98.9°F | Ht 63.0 in | Wt 254.0 lb

## 2019-03-26 DIAGNOSIS — E119 Type 2 diabetes mellitus without complications: Secondary | ICD-10-CM

## 2019-03-26 DIAGNOSIS — H8112 Benign paroxysmal vertigo, left ear: Secondary | ICD-10-CM | POA: Diagnosis not present

## 2019-03-26 LAB — BAYER DCA HB A1C WAIVED: HB A1C (BAYER DCA - WAIVED): 6.1 % (ref <7.0)

## 2019-03-26 MED ORDER — MECLIZINE HCL 25 MG PO TABS: 25.0000 mg | ORAL_TABLET | Freq: Three times a day (TID) | ORAL | 1 refills | Status: AC | PRN

## 2019-03-26 NOTE — Assessment & Plan Note (Signed)
Under good control with A1c of 6.1- continue current regimen. Continue to monitor. Call with any concerns.

## 2019-03-26 NOTE — Patient Instructions (Signed)
Benign Positional Vertigo Vertigo is the feeling that you or your surroundings are moving when they are not. Benign positional vertigo is the most common form of vertigo. This is usually a harmless condition (benign). This condition is positional. This means that symptoms are triggered by certain movements and positions. This condition can be dangerous if it occurs while you are doing something that could cause harm to you or others. This includes activities such as driving or operating machinery. What are the causes? In many cases, the cause of this condition is not known. It may be caused by a disturbance in an area of the inner ear that helps your brain to sense movement and balance. This disturbance can be caused by:  Viral infection (labyrinthitis).  Head injury.  Repetitive motion, such as jumping, dancing, or running. What increases the risk? You are more likely to develop this condition if:  You are a woman.  You are 50 years of age or older. What are the signs or symptoms? Symptoms of this condition usually happen when you move your head or your eyes in different directions. Symptoms may start suddenly, and usually last for less than a minute. They include:  Loss of balance and falling.  Feeling like you are spinning or moving.  Feeling like your surroundings are spinning or moving.  Nausea and vomiting.  Blurred vision.  Dizziness.  Involuntary eye movement (nystagmus). Symptoms can be mild and cause only minor problems, or they can be severe and interfere with daily life. Episodes of benign positional vertigo may return (recur) over time. Symptoms may improve over time. How is this diagnosed? This condition may be diagnosed based on:  Your medical history.  Physical exam of the head, neck, and ears.  Tests, such as: ? MRI. ? CT scan. ? Eye movement tests. Your health care provider may ask you to change positions quickly while he or she watches you for symptoms  of benign positional vertigo, such as nystagmus. Eye movement may be tested with a variety of exams that are designed to evaluate or stimulate vertigo. ? An electroencephalogram (EEG). This records electrical activity in your brain. ? Hearing tests. You may be referred to a health care provider who specializes in ear, nose, and throat (ENT) problems (otolaryngologist) or a provider who specializes in disorders of the nervous system (neurologist). How is this treated?  This condition may be treated in a session in which your health care provider moves your head in specific positions to adjust your inner ear back to normal. Treatment for this condition may take several sessions. Surgery may be needed in severe cases, but this is rare. In some cases, benign positional vertigo may resolve on its own in 2-4 weeks. Follow these instructions at home: Safety  Move slowly. Avoid sudden body or head movements or certain positions, as told by your health care provider.  Avoid driving until your health care provider says it is safe for you to do so.  Avoid operating heavy machinery until your health care provider says it is safe for you to do so.  Avoid doing any tasks that would be dangerous to you or others if vertigo occurs.  If you have trouble walking or keeping your balance, try using a cane for stability. If you feel dizzy or unstable, sit down right away.  Return to your normal activities as told by your health care provider. Ask your health care provider what activities are safe for you. General instructions  Take over-the-counter   and prescription medicines only as told by your health care provider.  Drink enough fluid to keep your urine pale yellow.  Keep all follow-up visits as told by your health care provider. This is important. Contact a health care provider if:  You have a fever.  Your condition gets worse or you develop new symptoms.  Your family or friends notice any  behavioral changes.  You have nausea or vomiting that gets worse.  You have numbness or a "pins and needles" sensation. Get help right away if you:  Have difficulty speaking or moving.  Are always dizzy.  Faint.  Develop severe headaches.  Have weakness in your legs or arms.  Have changes in your hearing or vision.  Develop a stiff neck.  Develop sensitivity to light. Summary  Vertigo is the feeling that you or your surroundings are moving when they are not. Benign positional vertigo is the most common form of vertigo.  The cause of this condition is not known. It may be caused by a disturbance in an area of the inner ear that helps your brain to sense movement and balance.  Symptoms include loss of balance and falling, feeling that you or your surroundings are moving, nausea and vomiting, and blurred vision.  This condition can be diagnosed based on symptoms, physical exam, and other tests, such as MRI, CT scan, eye movement tests, and hearing tests.  Follow safety instructions as told by your health care provider. You will also be told when to contact your health care provider in case of problems. This information is not intended to replace advice given to you by your health care provider. Make sure you discuss any questions you have with your health care provider. Document Released: 06/04/2006 Document Revised: 02/05/2018 Document Reviewed: 02/05/2018 Elsevier Patient Education  2020 [employer].

## 2019-03-26 NOTE — Progress Notes (Signed)
BP 117/74   Pulse 80   Temp 98.9 F (37.2 C) (Oral)   Ht 5\' 3"  (1.6 m)   Wt 254 lb (115.2 kg)   SpO2 96%   BMI 44.99 kg/m    Subjective:    Patient ID: Courtney Cherry, female    DOB: Feb 11, 1946, 73 y.o.   MRN: [id number]  HPI: Courtney Cherry is a 73 y.o. female  Chief Complaint  Patient presents with  . Diabetes    pt would like to get antivert for dizziness    DIABETES Hypoglycemic episodes:yes- 1x in last 3 months Polydipsia/polyuria: no Visual disturbance: no Chest pain: no Paresthesias: no Glucose Monitoring: no  Accucheck frequency: Not Checking Taking Insulin?: no Blood Pressure Monitoring: not checking Retinal Examination: Not up to Date Foot Exam: Up to Date Diabetic Education: Completed Pneumovax: Up to Date Influenza: Up to Date Aspirin: no  DIZZINESS- has had 2 episodes in the past month or so Duration: about a month Description of symptoms: swimmy headed, off balance Duration of episode: a couple of hours Dizziness frequency: 2x in the past month or so Provoking factors: rolling over Aggravating factors:  Rolling over Triggered by rolling over in bed: yes Triggered by bending over: yes Aggravated by head movement: yes Aggravated by exertion, coughing, loud noises: no Recent head injury: no Recent or current viral symptoms: yes- congestion History of vasovagal episodes: no Nausea: no Vomiting: no Tinnitus: no Hearing loss: no Aural fullness: yes Headache: yes Photophobia/phonophobia: yes Unsteady gait: no Postural instability: no Diplopia, dysarthria, dysphagia or weakness: no Related to exertion: no Pallor: no Diaphoresis: yes Dyspnea: no Chest pain: no  Relevant past medical, surgical, family and social history reviewed and updated as indicated. Interim medical history since our last visit reviewed. Allergies and medications reviewed and updated.  Review of Systems  Constitutional: Positive for diaphoresis. Negative for  activity change, appetite change, chills, fatigue, fever and unexpected weight change.  HENT: Positive for congestion, postnasal drip and sinus pressure. Negative for dental problem, drooling, ear discharge, ear pain, facial swelling, hearing loss, mouth sores, nosebleeds, rhinorrhea, sinus pain, sneezing, sore throat, tinnitus, trouble swallowing and voice change.   Eyes: Negative.   Respiratory: Negative.   Cardiovascular: Negative.   Gastrointestinal: Negative.   Neurological: Positive for dizziness and light-headedness. Negative for tremors, seizures, syncope, facial asymmetry, speech difficulty, weakness, numbness and headaches.  Psychiatric/Behavioral: Negative.     Per HPI unless specifically indicated above     Objective:    BP 117/74   Pulse 80   Temp 98.9 F (37.2 C) (Oral)   Ht 5\' 3"  (1.6 m)   Wt 254 lb (115.2 kg)   SpO2 96%   BMI 44.99 kg/m   Wt Readings from Last 3 Encounters:  03/26/19 254 lb (115.2 kg)  12/23/18 252 lb (114.3 kg)  09/12/18 251 lb 3.2 oz (113.9 kg)    Physical Exam Vitals signs and nursing note reviewed.  Constitutional:      General: She is not in acute distress.    Appearance: Normal appearance. She is not ill-appearing, toxic-appearing or diaphoretic.  HENT:     Head: Normocephalic and atraumatic.     Right Ear: External ear normal.     Left Ear: External ear normal.     Nose: Nose normal.     Mouth/Throat:     Mouth: Mucous membranes are moist.     Pharynx: Oropharynx is clear.  Eyes:     General: No scleral icterus.  Right eye: No discharge.        Left eye: No discharge.     Extraocular Movements: Extraocular movements intact.     Right eye: Normal extraocular motion and no nystagmus.     Left eye: Nystagmus present. Normal extraocular motion.     Conjunctiva/sclera: Conjunctivae normal.     Pupils: Pupils are equal, round, and reactive to light.  Neck:     Musculoskeletal: Normal range of motion and neck supple.   Cardiovascular:     Rate and Rhythm: Normal rate and regular rhythm.     Pulses: Normal pulses.     Heart sounds: Normal heart sounds. No murmur. No friction rub. No gallop.   Pulmonary:     Effort: Pulmonary effort is normal. No respiratory distress.     Breath sounds: Normal breath sounds. No stridor. No wheezing, rhonchi or rales.  Chest:     Chest wall: No tenderness.  Musculoskeletal: Normal range of motion.  Skin:    General: Skin is warm and dry.     Capillary Refill: Capillary refill takes less than 2 seconds.     Coloration: Skin is not jaundiced or pale.     Findings: No bruising, erythema, lesion or rash.  Neurological:     General: No focal deficit present.     Mental Status: She is alert and oriented to person, place, and time. Mental status is at baseline.  Psychiatric:        Mood and Affect: Mood normal.        Behavior: Behavior normal.        Thought Content: Thought content normal.        Judgment: Judgment normal.     Results for orders placed or performed in visit on 12/23/18  Microscopic Examination   URINE  Result Value Ref Range   WBC, UA 11-30 (A) 0 - 5 /hpf   RBC 0-2 0 - 2 /hpf   Epithelial Cells (non renal) 0-10 0 - 10 /hpf   Bacteria, UA Moderate (A) None seen/Few  Urine Culture, Reflex   URINE  Result Value Ref Range   Urine Culture, Routine Final report (A)    Organism ID, Bacteria Escherichia coli (A)    ORGANISM ID, BACTERIA Comment    Antimicrobial Susceptibility Comment   Bayer DCA Hb A1c Waived  Result Value Ref Range   HB A1C (BAYER DCA - WAIVED) 6.2 <7.0 %  CBC with Differential/Platelet  Result Value Ref Range   WBC 8.6 3.4 - 10.8 x10E3/uL   RBC 4.85 3.77 - 5.28 x10E6/uL   Hemoglobin 14.2 11.1 - 15.9 g/dL   Hematocrit 42.8 34.0 - 46.6 %   MCV 88 79 - 97 fL   MCH 29.3 26.6 - 33.0 pg   MCHC 33.2 31.5 - 35.7 g/dL   RDW 13.2 11.7 - 15.4 %   Platelets 275 150 - 450 x10E3/uL   Neutrophils 59 Not Estab. %   Lymphs 28 Not Estab. %    Monocytes 8 Not Estab. %   Eos 3 Not Estab. %   Basos 2 Not Estab. %   Neutrophils Absolute 5.1 1.4 - 7.0 x10E3/uL   Lymphocytes Absolute 2.4 0.7 - 3.1 x10E3/uL   Monocytes Absolute 0.7 0.1 - 0.9 x10E3/uL   EOS (ABSOLUTE) 0.3 0.0 - 0.4 x10E3/uL   Basophils Absolute 0.1 0.0 - 0.2 x10E3/uL   Immature Granulocytes 0 Not Estab. %   Immature Grans (Abs) 0.0 0.0 - 0.1 x10E3/uL  Comprehensive metabolic  panel  Result Value Ref Range   Glucose 103 (H) 65 - 99 mg/dL   BUN 14 8 - 27 mg/dL   Creatinine, Ser 1.05 (H) 0.57 - 1.00 mg/dL   GFR calc non Af Amer 53 (L) >59 mL/min/1.73   GFR calc Af Amer 61 >59 mL/min/1.73   BUN/Creatinine Ratio 13 12 - 28   Sodium 141 134 - 144 mmol/L   Potassium 4.8 3.5 - 5.2 mmol/L   Chloride 104 96 - 106 mmol/L   CO2 19 (L) 20 - 29 mmol/L   Calcium 9.8 8.7 - 10.3 mg/dL   Total Protein 7.0 6.0 - 8.5 g/dL   Albumin 4.1 3.7 - 4.7 g/dL   Globulin, Total 2.9 1.5 - 4.5 g/dL   Albumin/Globulin Ratio 1.4 1.2 - 2.2   Bilirubin Total 0.4 0.0 - 1.2 mg/dL   Alkaline Phosphatase 63 39 - 117 IU/L   AST 33 0 - 40 IU/L   ALT 31 0 - 32 IU/L  Lipid Panel w/o Chol/HDL Ratio  Result Value Ref Range   Cholesterol, Total 221 (H) 100 - 199 mg/dL   Triglycerides 157 (H) 0 - 149 mg/dL   HDL 50 >39 mg/dL   VLDL Cholesterol Cal 31 5 - 40 mg/dL   LDL Calculated 140 (H) 0 - 99 mg/dL  Microalbumin, Urine Waived  Result Value Ref Range   Microalb, Ur Waived 10 0 - 19 mg/L   Creatinine, Urine Waived 100 10 - 300 mg/dL   Microalb/Creat Ratio <30 <30 mg/g  TSH  Result Value Ref Range   TSH 3.330 0.450 - 4.500 uIU/mL  UA/M w/rflx Culture, Routine   Specimen: Urine   URINE  Result Value Ref Range   Specific Gravity, UA 1.020 1.005 - 1.030   pH, UA 5.5 5.0 - 7.5   Color, UA Yellow Yellow   Appearance Ur Hazy (A) Clear   Leukocytes,UA 2+ (A) Negative   Protein,UA Negative Negative/Trace   Glucose, UA Negative Negative   Ketones, UA Negative Negative   RBC, UA Trace (A)  Negative   Bilirubin, UA Negative Negative   Urobilinogen, Ur 0.2 0.2 - 1.0 mg/dL   Nitrite, UA Negative Negative   Microscopic Examination See below:    Urinalysis Reflex Comment   VITAMIN D 25 Hydroxy (Vit-D Deficiency, Fractures)  Result Value Ref Range   Vit D, 25-Hydroxy 42.2 30.0 - 100.0 ng/mL  HIV Antibody (routine testing w rflx)  Result Value Ref Range   HIV Screen 4th Generation wRfx Non Reactive Non Reactive  Hepatitis C Antibody  Result Value Ref Range   Hep C Virus Ab <0.1 0.0 - 0.9 s/co ratio      Assessment & Plan:   Problem List Items Addressed This Visit      Endocrine   Diet-controlled diabetes mellitus ([place]) - Primary    Under good control with A1c of 6.1- continue current regimen. Continue to monitor. Call with any concerns.       Relevant Orders   Bayer DCA Hb A1c Waived    Other Visit Diagnoses    Benign paroxysmal positional vertigo of left ear       Will treat with meclizine and epley's manuver. Let us know if not getting better or getting worse. Continue to monitor.        Follow up plan: Return in about 3 months (around 06/26/2019) for Physical/Wellness/follow up.

## 2019-04-28 DIAGNOSIS — N390 Urinary tract infection, site not specified: Secondary | ICD-10-CM | POA: Diagnosis not present

## 2019-04-28 DIAGNOSIS — N183 Chronic kidney disease, stage 3 (moderate): Secondary | ICD-10-CM | POA: Diagnosis not present

## 2019-04-28 DIAGNOSIS — R809 Proteinuria, unspecified: Secondary | ICD-10-CM | POA: Diagnosis not present

## 2019-06-15 DIAGNOSIS — N1831 Chronic kidney disease, stage 3a: Secondary | ICD-10-CM | POA: Diagnosis not present

## 2019-06-15 DIAGNOSIS — Z6841 Body Mass Index (BMI) 40.0 and over, adult: Secondary | ICD-10-CM | POA: Diagnosis not present

## 2019-06-15 DIAGNOSIS — R0602 Shortness of breath: Secondary | ICD-10-CM | POA: Diagnosis not present

## 2019-06-15 DIAGNOSIS — E7800 Pure hypercholesterolemia, unspecified: Secondary | ICD-10-CM | POA: Diagnosis not present

## 2019-06-15 DIAGNOSIS — Z0000 Encounter for general adult medical examination without abnormal findings: Secondary | ICD-10-CM | POA: Diagnosis not present

## 2019-06-15 DIAGNOSIS — E119 Type 2 diabetes mellitus without complications: Secondary | ICD-10-CM | POA: Diagnosis not present

## 2019-06-24 DIAGNOSIS — R0602 Shortness of breath: Secondary | ICD-10-CM | POA: Diagnosis not present

## 2019-06-29 ENCOUNTER — Encounter: Payer: PPO | Admitting: Family Medicine

## 2019-06-30 DIAGNOSIS — R0600 Dyspnea, unspecified: Secondary | ICD-10-CM | POA: Diagnosis not present

## 2019-06-30 DIAGNOSIS — R05 Cough: Secondary | ICD-10-CM | POA: Diagnosis not present

## 2019-07-10 DIAGNOSIS — R0600 Dyspnea, unspecified: Secondary | ICD-10-CM | POA: Diagnosis not present

## 2019-07-22 IMAGING — MG DIGITAL SCREENING BILATERAL MAMMOGRAM WITH TOMO AND CAD
8 series · 8 of 24 positions shown · non-contrast
Comparison: Previous exam(s).

CLINICAL DATA: Screening.

EXAM:
DIGITAL SCREENING BILATERAL MAMMOGRAM WITH TOMO AND CAD

[L MLO synth-2D]
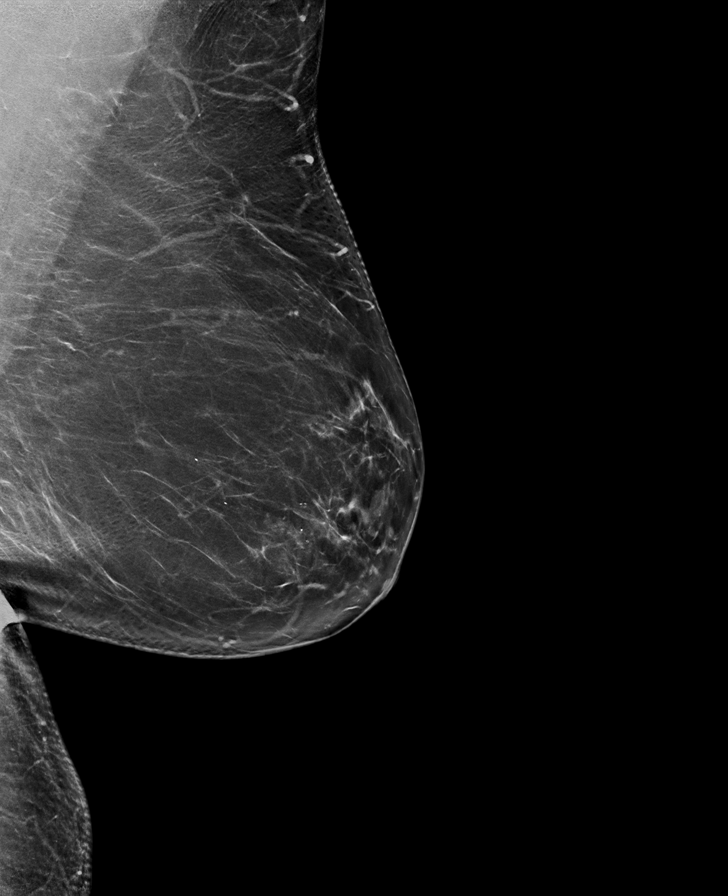

[R CC synth-2D]
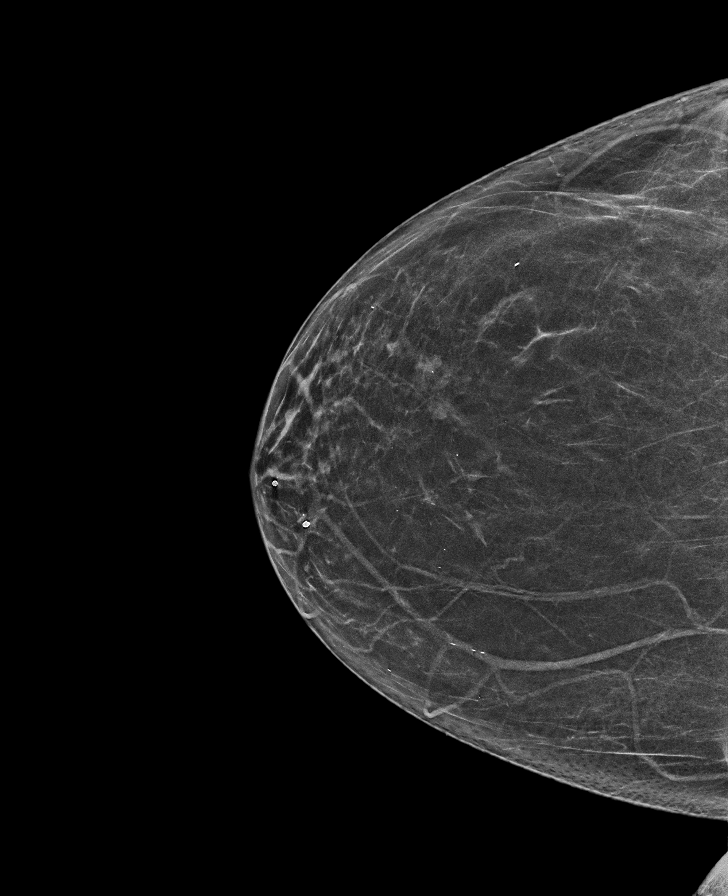

[L CC synth-2D]
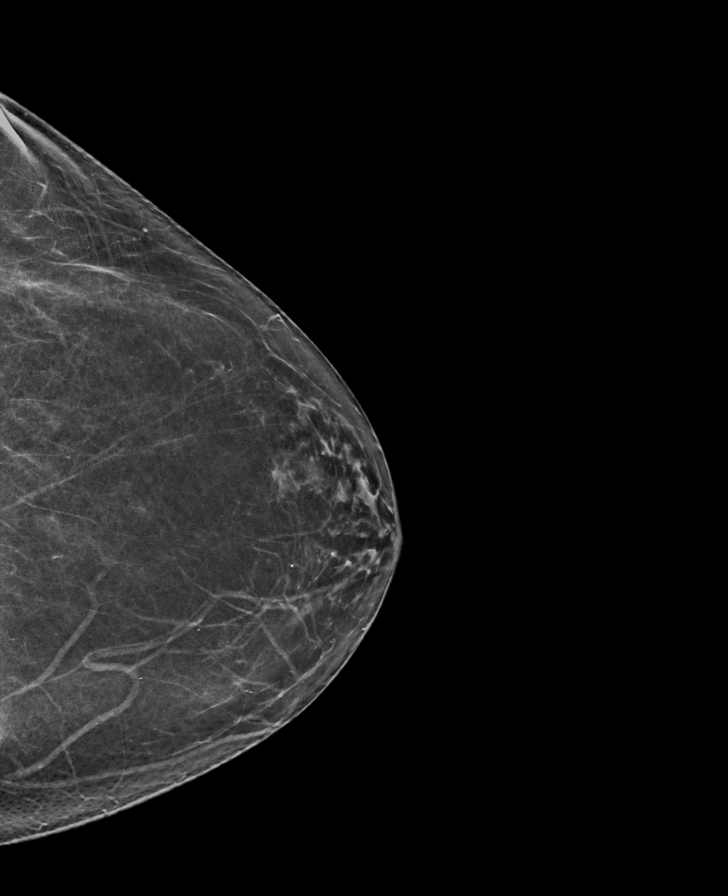

[R MLO synth-2D]
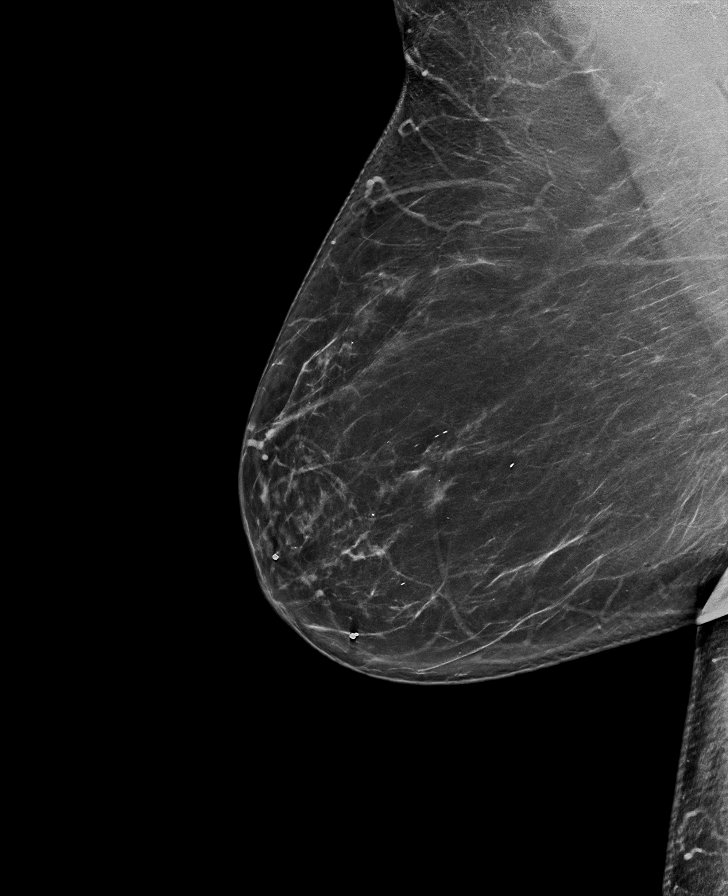

[R MLO tomo · tomo slice 40/79.0]
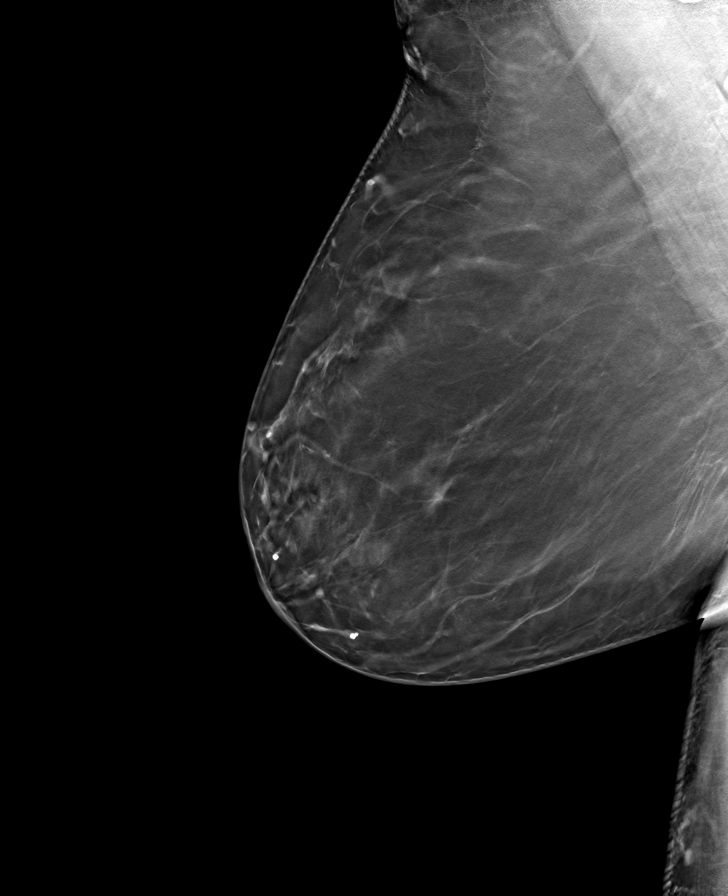

[L MLO tomo · tomo slice 40/79.0]
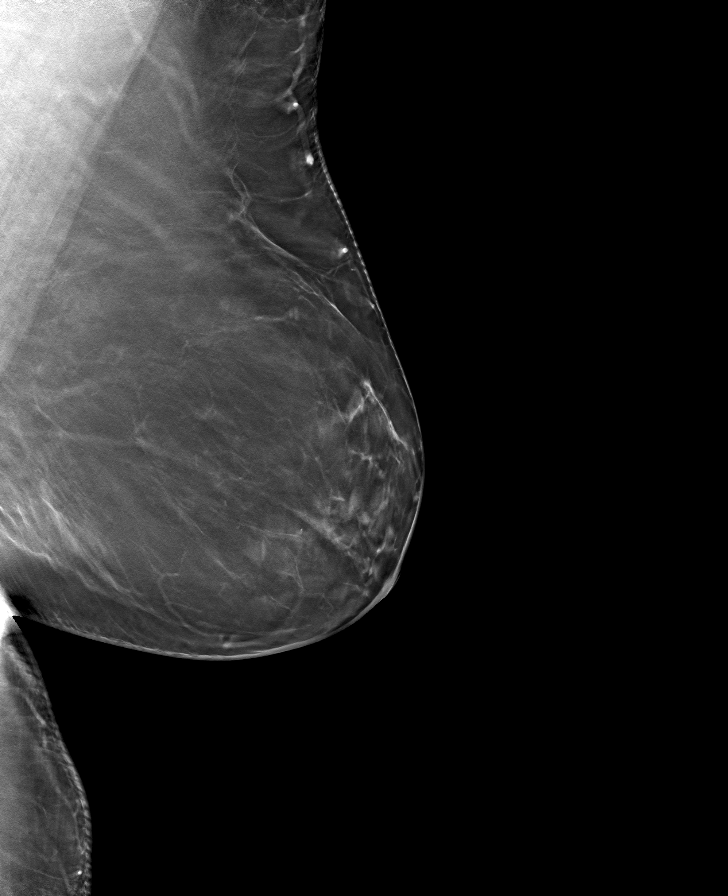

[R CC tomo · tomo slice 31/61.0]
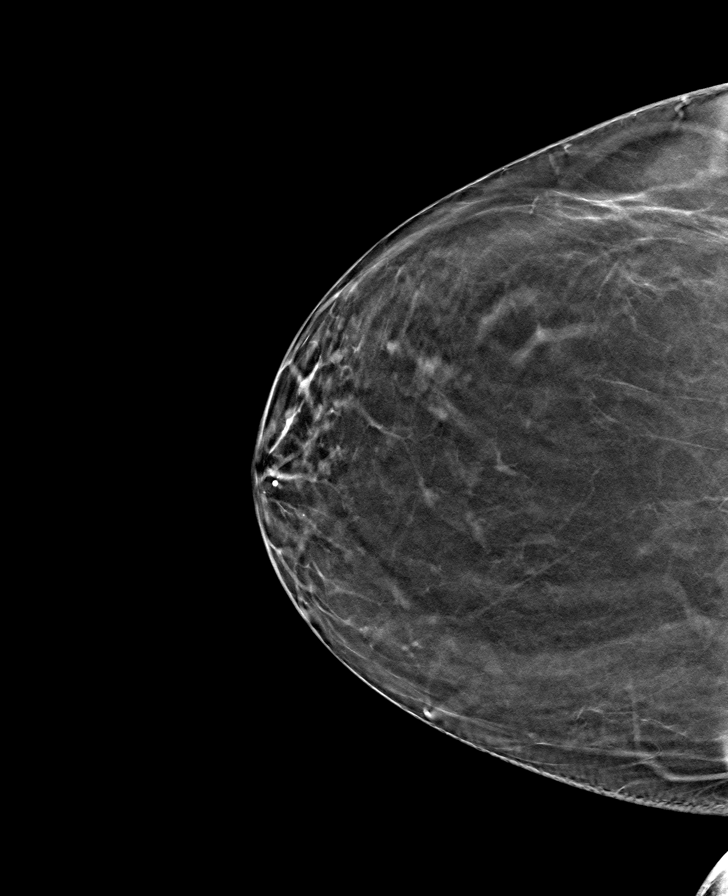

[L CC tomo · tomo slice 31/62.0]
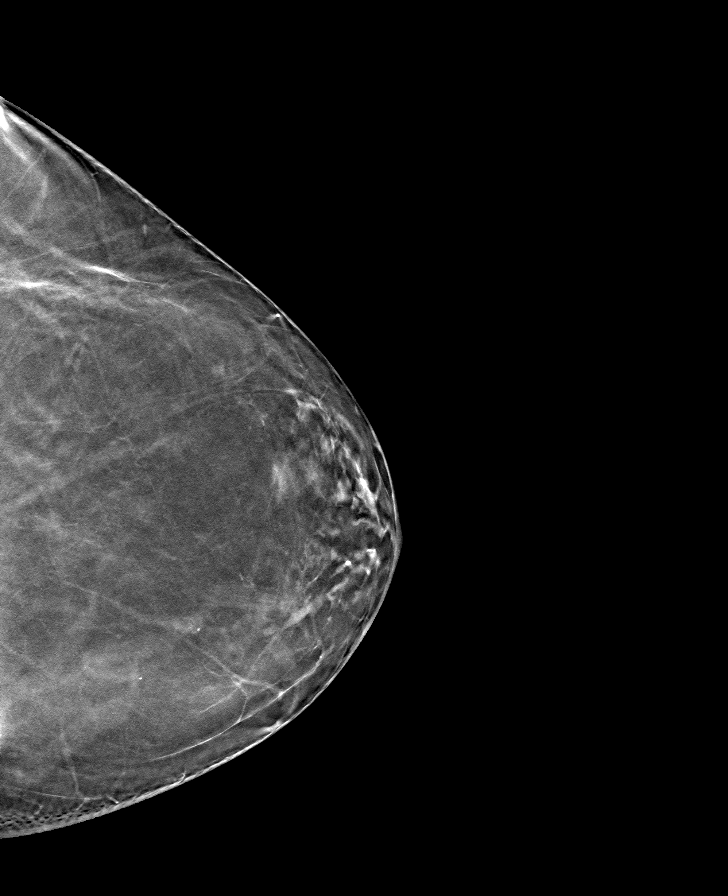

[8 of 24 positions shown; findings below may reference images not displayed]

ACR Breast Density Category b: There are scattered areas of
fibroglandular density.
FINDINGS: There are no findings suspicious for malignancy. Images were
processed with CAD.
IMPRESSION: No mammographic evidence of malignancy. A result letter of this
screening mammogram will be mailed directly to the patient.

RECOMMENDATION:
Screening mammogram in one year. (Code:[id number])

BI-RADS CATEGORY  1: Negative.

## 2019-07-23 DIAGNOSIS — R0600 Dyspnea, unspecified: Secondary | ICD-10-CM | POA: Diagnosis not present

## 2019-07-23 DIAGNOSIS — J310 Chronic rhinitis: Secondary | ICD-10-CM | POA: Diagnosis not present

## 2019-10-19 ENCOUNTER — Telehealth: Payer: Self-pay | Admitting: Family Medicine

## 2019-10-19 NOTE — Chronic Care Management (AMB) (Signed)
  Chronic Care Management   Outreach Note  10/19/2019 Name: NOA MAULLER MRN: [id number] DOB: 07-10-1946  CALEI PARRAL is a 74 y.o. year old female who is a primary care patient of Valerie Roys, DO. I reached out to [name] by phone today in response to a referral sent by Ms. Ralene [place]'s health plan.     An unsuccessful telephone outreach was attempted today. The patient was referred to the case management team for assistance with care management and care coordination.   Follow Up Plan: A HIPPA compliant phone message was left for the patient providing contact information and requesting a return call.  The care management team will reach out to the patient again over the next 7 days.  If patient returns call to provider office, please advise to call [facility] at [place], [place], [place], [place] [address] Direct Dial: [phone] [email or web address]: [email or web address]

## 2019-10-21 NOTE — Chronic Care Management (AMB) (Signed)
  Chronic Care Management   Outreach Note  10/21/2019 Name: Courtney Cherry MRN: [id number] DOB: 1946-04-26  Courtney Cherry is a 74 y.o. year old female who is a primary care patient of Valerie Roys, DO. I reached out to [name] by phone today in response to a referral sent by [name]'s health plan.     A second unsuccessful telephone outreach was attempted today. The patient was referred to the case management team for assistance with care management and care coordination.   Follow Up Plan: A HIPPA compliant phone message was left for the patient providing contact information and requesting a return call.  The care management team will reach out to the patient again over the next 7 days.  If patient returns call to provider office, please advise to call [name] at [place], [place], [place], [place] [address] Direct Dial: [phone] [email or web address]: [email or web address]

## 2019-10-26 NOTE — Chronic Care Management (AMB) (Signed)
  Chronic Care Management   Outreach Note  10/26/2019 Name: KYNZLEIGH CAVAN MRN: [id number] DOB: [birth year]  ALYONA KANU is a 74 y.o. year old female who is a primary care patient of Valerie Roys, DO. I reached out to [name] by phone today in response to a referral sent by Ms. Ralene [employer] [place]'s health plan.     Third unsuccessful telephone outreach was attempted today. The patient was referred to the case management team for assistance with care management and care coordination. The patient's primary care provider has been notified of our unsuccessful attempts to make or maintain contact with the patient. The care management team is pleased to engage with this patient at any time in the future should he/she be interested in assistance from the care management team.   Follow Up Plan: The care management team is available to follow up with the patient after provider conversation with the patient regarding recommendation for care management engagement and subsequent re-referral to the care management team.   Noreene Larsson, [place], [place], [place] [address] Direct Dial: [phone] [email or web address]: [email or web address]

## 2019-10-28 DIAGNOSIS — N1831 Chronic kidney disease, stage 3a: Secondary | ICD-10-CM | POA: Diagnosis not present

## 2020-01-06 ENCOUNTER — Other Ambulatory Visit: Payer: Self-pay | Admitting: Internal Medicine

## 2020-01-06 DIAGNOSIS — Z1231 Encounter for screening mammogram for malignant neoplasm of breast: Secondary | ICD-10-CM

## 2020-01-12 DIAGNOSIS — L821 Other seborrheic keratosis: Secondary | ICD-10-CM | POA: Diagnosis not present

## 2020-02-11 ENCOUNTER — Ambulatory Visit
Admission: RE | Admit: 2020-02-11 | Discharge: 2020-02-11 | Disposition: A | Discharge status: Home / Self Care | Payer: PPO | Source: Ambulatory Visit | Attending: Internal Medicine | Admitting: Internal Medicine

## 2020-02-11 DIAGNOSIS — Z1231 Encounter for screening mammogram for malignant neoplasm of breast: Secondary | ICD-10-CM | POA: Insufficient documentation

## 2020-02-17 DIAGNOSIS — H5371 Glare sensitivity: Secondary | ICD-10-CM | POA: Diagnosis not present

## 2020-04-26 DIAGNOSIS — N1831 Chronic kidney disease, stage 3a: Secondary | ICD-10-CM | POA: Diagnosis not present

## 2020-05-02 DIAGNOSIS — N182 Chronic kidney disease, stage 2 (mild): Secondary | ICD-10-CM | POA: Diagnosis not present

## 2020-05-09 DIAGNOSIS — R808 Other proteinuria: Secondary | ICD-10-CM | POA: Diagnosis not present

## 2020-05-09 DIAGNOSIS — R319 Hematuria, unspecified: Secondary | ICD-10-CM | POA: Diagnosis not present

## 2020-05-09 DIAGNOSIS — R809 Proteinuria, unspecified: Secondary | ICD-10-CM | POA: Diagnosis not present

## 2020-05-09 DIAGNOSIS — R82998 Other abnormal findings in urine: Secondary | ICD-10-CM | POA: Diagnosis not present

## 2020-06-08 NOTE — Progress Notes (Signed)
06/09/2020 11:52 AM   Courtney Cherry 09-07-1946 [id number]  Referring provider: Valerie Roys, DO [address],  [place] [address] Chief Complaint  Patient presents with  . Recurrent UTI    HPI: Courtney Cherry is a 74 y.o. female who returns for a 8 month follow up of possible UTI.   She had a UTI infection back in 09/2019. She had urinary frequency, nausea, back/flank pain, lower abdominal pain/bloating. She was taking cranberry capsules. UA noted 1+ RBC and 3+leukocytes, >30 WBC and may bacteria. Urine culture grew citrobacter youngae. Patient was treated for a UTI and given Ceftin 250 mg BID. She was later advised to stop Ceftin and start Doxycycline 100 mg BID x 7 days.   Today the patient is experiencing lower pelvic pressure, dysuria, back pain radiating down lower extremities and incomplete bladder emptying. She report some vaginal swelling and discomfort. She notes urgency and frequency. She is taking good hygiene precautions. Denies hematuria. Symptoms are similar with previous infections. After antibiotic courses the symptoms always seem to return.  The patient used topical estrogen cream in the past. On daily cranberry tablets daily, on d'mannos occasionally and she is drinking plenty water. She likes to drink teat and coffee and for the last 2 months she has tried to cute back on tea and coffee. Her bowels are normal and she is taking elderberry and zinc.   She does not feel like symptoms are related to sexually activity.   (+) urine culture: 10/2018: Citrobacter resistant to Augmentin and cefazolin 04/2019: E. Coli. pan sensitive 10/2019: E. Coli pan sensitive 04/2020: Mixed flora   PMH: Past Medical History:  Diagnosis Date  . Anemia 1951  . Arthritis   . C. difficile diarrhea   . Chronic kidney disease    chronic kidney disease Stage 3   . Chronic lower back pain   . Colon polyp    family history  . DDD (degenerative disc disease), lumbar   .  Depression   . Diabetes mellitus without complication ([place])   . Eczema   . Fibromyalgia    "went away after I stopped using artificial sweeteners"  . Hypercholesteremia   . Jaundice    "w/gallstones"  . Obesity   . Obesity   . Osteoarthritis    lumbar spine and knees  . Osteopenia   . Pneumonia ~ 2002; 1995  . Polyp of colon, adenomatous   . Pulmonary nodule   . Shingles   . Shortness of breath    exertion due to back  . Urinary urgency   . Venous stasis   . Vitamin D deficiency   . Vitamin D deficiency     Surgical History: Past Surgical History:  Procedure Laterality Date  . ABDOMINAL HYSTERECTOMY  1974  . [place]; 1999   left screws and plate 1997  and removed 1999  . BACK SURGERY     fusion 2008 L4-L5  . BACK SURGERY    . BREAST BIOPSY Left 2000s   NEG  . CATARACT EXTRACTION W/ INTRAOCULAR LENS  IMPLANT, BILATERAL  ~ 2009  . CHOLECYSTECTOMY  1966  . COLONOSCOPY WITH PROPOFOL N/A 09/05/2016   Procedure: COLONOSCOPY WITH PROPOFOL;  Surgeon: Manya Silvas, MD;  Location[facility] ENDOSCOPY;  Service: Endoscopy;  Laterality: N/A;  . COLONOSCOPY WITH PROPOFOL N/A 01/29/2018   Procedure: COLONOSCOPY WITH PROPOFOL;  Surgeon: Manya Silvas, MD;  Location: [facility] ENDOSCOPY;  Service: Endoscopy;  Laterality: N/A;  .  EYE SURGERY     3 surgeries left 2010,and 1 right eye 2009  . FRACTURE SURGERY    . JOINT REPLACEMENT Left 12/08/2014  . KNEE ARTHROSCOPY  2006   left   . 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  . MICRODISCECTOMY LUMBAR  02/2011  . NASAL RECONSTRUCTION WITH SEPTAL REPAIR    . OOPHORECTOMY     one ovary left  . ORIF ANKLE FRACTURE     left ankle reconstuction  . POSTERIOR FUSION LUMBAR SPINE  02/01/12  . TONSILLECTOMY  1953  . TOTAL KNEE ARTHROPLASTY Left     Home Medications:  Allergies as of 06/09/2020      Reactions   Sulfa Antibiotics Rash   "gets severe cause I let it go so long"   Adhesive  [tape] Other (See Comments)   "pulls my skin; drying; burns a little"   Valium [diazepam] Other (See Comments)   "I get hyper; can't sit still"   Castor Oil Other (See Comments)      Medication List       Accurate as of June 09, 2020 11:59 PM. If you have any questions, ask your nurse or doctor.        CALCIUM-MAGNESIUM-VITAMIN D PO Take 1 tablet by mouth 2 (two) times daily.   ciprofloxacin 250 MG tablet Commonly known as: Cipro Take 1 tablet (250 mg total) by mouth 2 (two) times daily. Started by: Zara Council, PA-C   meclizine 25 MG tablet Commonly known as: ANTIVERT Take 1 tablet (25 mg total) by mouth 3 (three) times daily as needed for dizziness.   montelukast 10 MG tablet Commonly known as: SINGULAIR Take 1 tablet (10 mg total) by mouth at bedtime.   Premarin vaginal cream Generic drug: conjugated estrogens Apply 0.5mg  (pea-sized amount)  just inside the vaginal introitus with a finger-tip on  Monday, Wednesday and Friday nights. Started by: Zara Council, PA-C   PROBIOTIC ADVANCED PO Take by mouth.       Allergies:  Allergies  Allergen Reactions  . Sulfa Antibiotics Rash    "gets severe cause I let it go so long"  . Adhesive [Tape] Other (See Comments)    "pulls my skin; drying; burns a little"  . Valium [Diazepam] Other (See Comments)    "I get hyper; can't sit still"  . Castor Oil Other (See Comments)    Family History: Family History  Problem Relation Age of Onset  . Breast cancer Paternal Grandmother 72       and 70  . Cystic fibrosis Mother   . Stroke Mother   . Asthma Father   . Anesthesia problems Neg Hx   . Hypotension Neg Hx   . Malignant hyperthermia Neg Hx   . Pseudochol deficiency Neg Hx     Social History:  reports that she has never smoked. She has never used smokeless tobacco. She reports that she does not drink alcohol and does not use drugs.   Physical Exam: BP 127/86   Pulse 87   Ht 5\' 3"  (1.6 m)   Wt 240 lb  (108.9 kg)   BMI 42.51 kg/m   Constitutional:  Well nourished. Alert and oriented, No acute distress. HEENT: [place] AT, mask in place  Trachea midline Cardiovascular: No clubbing, cyanosis, or edema. Respiratory: Normal respiratory effort, no increased work of breathing. Neurologic: Grossly intact, no focal deficits, moving all 4 extremities. Psychiatric: Normal mood and affect.  Laboratory Data:  Urinalysis Component     Latest Ref Rng & Units 06/09/2020  Specific Gravity, UA     1.005 - 1.030 1.015  pH, UA     5.0 - 7.5 6.5  Color, UA     Yellow Yellow  Appearance Ur     Clear Cloudy (A)  Leukocytes,UA     Negative 1+ (A)  Protein,UA     Negative/Trace Negative  Glucose, UA     Negative Negative  Ketones, UA     Negative Negative  RBC, UA     Negative Trace (A)  Bilirubin, UA     Negative Negative  Urobilinogen, Ur     0.2 - 1.0 mg/dL 0.2  Nitrite, UA     Negative Negative  Microscopic Examination      See below:   Component     Latest Ref Rng & Units 06/09/2020          WBC, UA     0 - 5 /hpf >30 (A)  RBC     0 - 2 /hpf 0-2  Epithelial Cells (non renal)     0 - 10 /hpf 0-10  Bacteria, UA     None seen/Few Few    Assessment & Plan:    1. Recurrent UTI - Patient is currently engaging in preventative strategies  - Will add vaginal estrogen cream  - Obtain renal US to rule out a nidus for cause of rUTI - UA shows pyuria, will send for culture and pt will start Cipro 250 mg empirically   2. Vaginal atrophy - Start estrogen cream 3 times/week (Monday/Wednesday/Friday) - RTC in 2-3 weeks for exam.    Return in about 3 weeks (around 06/30/2020) for RUS report and exam .  [facility] [address] [place], [place] [address] [phone]  I, Selena Batten, am acting as a scribe for [employer],  I have reviewed the above documentation for accuracy and completeness, and I agree with the above.     Zara Council, PA-C

## 2020-06-09 ENCOUNTER — Encounter: Payer: Self-pay | Admitting: Urology

## 2020-06-09 ENCOUNTER — Other Ambulatory Visit: Payer: Self-pay

## 2020-06-09 ENCOUNTER — Ambulatory Visit (INDEPENDENT_AMBULATORY_CARE_PROVIDER_SITE_OTHER): Payer: PPO | Admitting: Urology

## 2020-06-09 VITALS — BP 127/86 | HR 87 | Ht 63.0 in | Wt 240.0 lb

## 2020-06-09 DIAGNOSIS — N952 Postmenopausal atrophic vaginitis: Secondary | ICD-10-CM | POA: Diagnosis not present

## 2020-06-09 DIAGNOSIS — N390 Urinary tract infection, site not specified: Secondary | ICD-10-CM

## 2020-06-09 LAB — MICROSCOPIC EXAMINATION: WBC, UA: >30 /HPF — AB (ref 0–5)

## 2020-06-09 LAB — URINALYSIS, COMPLETE
Bilirubin, UA: NEGATIVE
Glucose, UA: NEGATIVE
Ketones, UA: NEGATIVE
Nitrite, UA: NEGATIVE
Protein,UA: NEGATIVE
Specific Gravity, UA: 1.015 (ref 1.005–1.030)
Urobilinogen, Ur: 0.2 mg/dL (ref 0.2–1.0)
pH, UA: 6.5 (ref 5.0–7.5)

## 2020-06-09 LAB — BLADDER SCAN AMB NON-IMAGING: Scan Result: 96

## 2020-06-09 MED ORDER — PREMARIN 0.625 MG/GM VA CREA: TOPICAL_CREAM | VAGINAL | 12 refills | Status: AC

## 2020-06-09 MED ORDER — CIPROFLOXACIN HCL 250 MG PO TABS: 250.0000 mg | ORAL_TABLET | Freq: Two times a day (BID) | ORAL | 0 refills | Status: DC

## 2020-06-13 ENCOUNTER — Telehealth: Payer: Self-pay | Admitting: Family Medicine

## 2020-06-13 LAB — CULTURE, URINE COMPREHENSIVE

## 2020-06-13 NOTE — Telephone Encounter (Signed)
-----   Message from Nori Riis, PA-C sent at 06/13/2020  2:20 PM EDT ----- Please let Mrs. Yorke know that her urine did not grow positive for infection.  She has an ultrasound ordered for her kidneys, so we will wait to see what that shows [place].

## 2020-06-13 NOTE — Telephone Encounter (Signed)
LMOM informed patient of negative urine culture and we will wait to see the Ultrasound.

## 2020-06-23 ENCOUNTER — Other Ambulatory Visit: Payer: Self-pay

## 2020-06-23 ENCOUNTER — Ambulatory Visit
Admission: RE | Admit: 2020-06-23 | Discharge: 2020-06-23 | Disposition: A | Discharge status: Home / Self Care | Payer: PPO | Source: Ambulatory Visit | Attending: Urology | Admitting: Urology

## 2020-06-23 DIAGNOSIS — N390 Urinary tract infection, site not specified: Secondary | ICD-10-CM | POA: Diagnosis not present

## 2020-06-28 NOTE — Progress Notes (Signed)
06/09/2020 1:02 PM   Courtney Cherry 13-Jun-1946 [id number]  Referring provider: Valerie Roys, DO [place],  [place] [address] Chief Complaint  Patient presents with  . Follow-up    HPI: Courtney Cherry is a 74 y.o. female with persistent dysuria and vaginal atrophy.  Date  Organism  Antibiotic  10/28/2019  E. Coli-pansensitive ceftin 250 BID, Doxycycline 100 mg  April 26, 2020  yeast isolate   May 09, 2020  mixed urogenital flora  cephalexin 500 mg BID x5 days  June 09, 2020  mixed urogenital flora  no antibiotic prescribed   RUS 06/23/2020 no hydro-, no renal masses and no renal stones appreciated.    She continues to have back pain, suprapubic pain and dysuria.  She has been applying the vaginal estrogen cream three nights weekly.   Patient denies any modifying or aggravating factors.  Patient denies any gross hematuria, dysuria or suprapubic/flank pain.  Patient denies any fevers, chills, nausea or vomiting.   PMH: Past Medical History:  Diagnosis Date  . Anemia 1951  . Arthritis   . C. difficile diarrhea   . Chronic kidney disease    chronic kidney disease Stage 3   . Chronic lower back pain   . Colon polyp    family history  . DDD (degenerative disc disease), lumbar   . Depression   . Diabetes mellitus without complication ([facility])   . Eczema   . Fibromyalgia    "went away after I stopped using artificial sweeteners"  . Hypercholesteremia   . Jaundice    "w/gallstones"  . Obesity   . Obesity   . Osteoarthritis    lumbar spine and knees  . Osteopenia   . Pneumonia ~ 2002; 1995  . Polyp of colon, adenomatous   . Pulmonary nodule   . Shingles   . Shortness of breath    exertion due to back  . Urinary urgency   . Venous stasis   . Vitamin D deficiency   . Vitamin D deficiency     Surgical History: Past Surgical History:  Procedure Laterality Date  . ABDOMINAL HYSTERECTOMY  1974  . [place]; 1999   left screws  and plate 1997  and removed 1999  . BACK SURGERY     fusion 2008 L4-L5  . BACK SURGERY    . BREAST BIOPSY Left 2000s   NEG  . CATARACT EXTRACTION W/ INTRAOCULAR LENS  IMPLANT, BILATERAL  ~ 2009  . CHOLECYSTECTOMY  1966  . COLONOSCOPY WITH PROPOFOL N/A 09/05/2016   Procedure: COLONOSCOPY WITH PROPOFOL;  Surgeon: Manya Silvas, MD;  Location[facility] ENDOSCOPY;  Service: Endoscopy;  Laterality: N/A;  . COLONOSCOPY WITH PROPOFOL N/A 01/29/2018   Procedure: COLONOSCOPY WITH PROPOFOL;  Surgeon: Manya Silvas, MD;  Location: [facility] ENDOSCOPY;  Service: Endoscopy;  Laterality: N/A;  . EYE SURGERY     3 surgeries left 2010,and 1 right eye 2009  . FRACTURE SURGERY    . JOINT REPLACEMENT Left 12/08/2014  . KNEE ARTHROSCOPY  2006   left   . 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  . MICRODISCECTOMY LUMBAR  02/2011  . NASAL RECONSTRUCTION WITH SEPTAL REPAIR    . OOPHORECTOMY     one ovary left  . ORIF ANKLE FRACTURE     left ankle reconstuction  . POSTERIOR FUSION LUMBAR SPINE  02/01/12  . TONSILLECTOMY  1953  .  TOTAL KNEE ARTHROPLASTY Left     Home Medications:  Allergies as of 06/29/2020      Reactions   Sulfa Antibiotics Rash   "gets severe cause I let it go so long"   Adhesive [tape] Other (See Comments)   "pulls my skin; drying; burns a little"   Valium [diazepam] Other (See Comments)   "I get hyper; can't sit still"   Castor Oil Other (See Comments)      Medication List       Accurate as of June 29, 2020 11:59 PM. If you have any questions, ask your nurse or doctor.        STOP taking these medications   CALCIUM-MAGNESIUM-VITAMIN D PO Stopped by: Peta Peachey, PA-C   ciprofloxacin 250 MG tablet Commonly known as: Cipro Stopped by: Naraly Fritcher, PA-C   montelukast 10 MG tablet Commonly known as: SINGULAIR Stopped by: Zara Council, PA-C     TAKE these medications   ELDERBERRY PO Take by mouth.   meclizine 25 MG  tablet Commonly known as: ANTIVERT Take 1 tablet (25 mg total) by mouth 3 (three) times daily as needed for dizziness.   Premarin vaginal cream Generic drug: conjugated estrogens Apply 0.5mg  (pea-sized amount)  just inside the vaginal introitus with a finger-tip on  Monday, Wednesday and Friday nights.   PROBIOTIC ADVANCED PO Take by mouth.       Allergies:  Allergies  Allergen Reactions  . Sulfa Antibiotics Rash    "gets severe cause I let it go so long"  . Adhesive [Tape] Other (See Comments)    "pulls my skin; drying; burns a little"  . Valium [Diazepam] Other (See Comments)    "I get hyper; can't sit still"  . Castor Oil Other (See Comments)    Family History: Family History  Problem Relation Age of Onset  . Breast cancer Paternal Grandmother 47       and 16  . Cystic fibrosis Mother   . Stroke Mother   . Asthma Father   . Anesthesia problems Neg Hx   . Hypotension Neg Hx   . Malignant hyperthermia Neg Hx   . Pseudochol deficiency Neg Hx     Social History:  reports that she has never smoked. She has never used smokeless tobacco. She reports that she does not drink alcohol and does not use drugs.   Physical Exam: BP 120/76   Pulse 77   Ht 5\' 3"  (1.6 m)   Wt 240 lb (108.9 kg)   SpO2 97%   BMI 42.51 kg/m   Constitutional:  Well nourished. Alert and oriented, No acute distress. HEENT: [place] AT, mask in place.  Trachea midline Cardiovascular: No clubbing, cyanosis, or edema. Respiratory: Normal respiratory effort, no increased work of breathing. GI: Abdomen is soft, non tender, non distended, no abdominal masses. Liver and spleen not palpable.  No hernias appreciated.  Stool sample for occult testing is not indicated.   GU: No CVA tenderness.  No bladder fullness or masses.  Atrophic external genitalia, sparse pubic hair distribution, no lesions.  Normal urethral meatus, no lesions, no prolapse, no discharge.   No urethral masses, tenderness and/or tenderness. No  bladder fullness, tenderness or masses. Pale vagina mucosa, poor estrogen effect, no discharge, no lesions, fair pelvic support, no cystocele and no rectocele noted.   Neurologic: Grossly intact, no focal deficits, moving all 4 extremities. Psychiatric: Normal mood and affect.   Laboratory Data: Urinalysis Component     Latest Ref Rng &  Units 06/09/2020  Specific Gravity, UA     1.005 - 1.030 1.015  pH, UA     5.0 - 7.5 6.5  Color, UA     Yellow Yellow  Appearance Ur     Clear Cloudy (A)  Leukocytes,UA     Negative 1+ (A)  Protein,UA     Negative/Trace Negative  Glucose, UA     Negative Negative  Ketones, UA     Negative Negative  RBC, UA     Negative Trace (A)  Bilirubin, UA     Negative Negative  Urobilinogen, Ur     0.2 - 1.0 mg/dL 0.2  Nitrite, UA     Negative Negative  Microscopic Examination      See below:   Component     Latest Ref Rng & Units 06/09/2020          WBC, UA     0 - 5 /hpf >30 (A)  RBC     0 - 2 /hpf 0-2  Epithelial Cells (non renal)     0 - 10 /hpf 0-10  Bacteria, UA     None seen/Few Few   Pertinent Imaging CLINICAL DATA:  74 year old female with recurrent UTIs.  EXAM: RENAL / URINARY TRACT ULTRASOUND COMPLETE  COMPARISON:  10/31/2016 ultrasound.  FINDINGS: Right Kidney:  Renal measurements: 10 x 3.7 x 4.8 cm = volume: 92 mL. Mild cortical thinning noted. No focal parenchymal abnormalities are present. Echogenicity within normal limits. No mass or hydronephrosis visualized.  Left Kidney:  Renal measurements: 9.1 x 4.5 x 4 cm = volume: 85 mL. Mild cortical thinning noted. No focal parenchymal abnormalities are present. Echogenicity within normal limits. No mass or hydronephrosis visualized.  Bladder:  Appears normal for degree of bladder distention.  Other:  None.  IMPRESSION: 1. Mild bilateral renal cortical thinning. 2. No other significant finding.   Electronically Signed   By: Margarette Canada M.D.    On: 06/25/2020 12:32 I have independently reviewed the films.  See HPI.   Assessment & Plan:    1. Recurrent UTI - Patient is currently engaging in preventative strategies  - RUS did not demonstrate a nidus for infection  -Last 2 urine cultures grew out mixed urogenital flora and patient remains symptomatic with dysuria, suprapubic pain and lower back pain - I will have her undergo cystoscopy for further evaluation of her persistent dysuria - I have explained to the patient that they will  be scheduled for a cystoscopy in our office to evaluate their bladder.  The cystoscopy consists of passing a tube with a lens up through their urethra and into their urinary bladder.   We will inject the urethra with a lidocaine gel prior to introducing the cystoscope to help with any discomfort during the procedure.   After the procedure, they might experience blood in the urine and discomfort with urination.  This will abate after the first few voids.  I have  encouraged the patient to increase water intake  during this time.  Patient denies any allergies to lidocaine.   2. Vaginal atrophy - Continue vaginal estrogen cream Monday, Wednesday and Friday nights    Return for cysto with Dr. Bernardo Heater for persistent dysuria with negative cultures .  [facility] [address] [place], [place] [address] [phone]

## 2020-06-29 ENCOUNTER — Other Ambulatory Visit: Payer: Self-pay

## 2020-06-29 ENCOUNTER — Ambulatory Visit: Payer: PPO | Admitting: Urology

## 2020-06-29 ENCOUNTER — Encounter: Payer: Self-pay | Admitting: Urology

## 2020-06-29 VITALS — BP 120/76 | HR 77 | Ht 63.0 in | Wt 240.0 lb

## 2020-06-29 DIAGNOSIS — N390 Urinary tract infection, site not specified: Secondary | ICD-10-CM

## 2020-06-29 DIAGNOSIS — N952 Postmenopausal atrophic vaginitis: Secondary | ICD-10-CM

## 2020-06-29 NOTE — Patient Instructions (Signed)
Cystoscopy Cystoscopy is a procedure that is used to help diagnose and sometimes treat conditions that affect the lower urinary tract. The lower urinary tract includes the bladder and the urethra. The urethra is the tube that drains urine from the bladder. Cystoscopy is done using a thin, tube-shaped instrument with a light and camera at the end (cystoscope). The cystoscope may be hard or flexible, depending on the goal of the procedure. The cystoscope is inserted through the urethra, into the bladder. Cystoscopy may be recommended if you have:  Urinary tract infections that keep coming back.  Blood in the urine (hematuria).  An inability to control when you urinate (urinary incontinence) or an overactive bladder.  Unusual cells found in a urine sample.  A blockage in the urethra, such as a urinary stone.  Painful urination.  An abnormality in the bladder found during an intravenous pyelogram (IVP) or CT scan. Cystoscopy may also be done to remove a sample of tissue to be examined under a microscope (biopsy). What are the risks? Generally, this is a safe procedure. However, problems may occur, including:  Infection.  Bleeding.  What happens during the procedure?  1. You will be given one or more of the following: ? A medicine to numb the area (local anesthetic). 2. The area around the opening of your urethra will be cleaned. 3. The cystoscope will be passed through your urethra into your bladder. 4. Germ-free (sterile) fluid will flow through the cystoscope to fill your bladder. The fluid will stretch your bladder so that your health care provider can clearly examine your bladder walls. 5. Your doctor will look at the urethra and bladder. 6. The cystoscope will be removed The procedure may vary among health care providers  What can I expect after the procedure? After the procedure, it is common to have: 1. Some soreness or pain in your abdomen and urethra. 2. Urinary symptoms.  These include: ? Mild pain or burning when you urinate. Pain should stop within a few minutes after you urinate. This may last for up to 1 week. ? A small amount of blood in your urine for several days. ? Feeling like you need to urinate but producing only a small amount of urine. Follow these instructions at home: General instructions  Return to your normal activities as told by your health care provider.   Do not drive for 24 hours if you were given a sedative during your procedure.  Watch for any blood in your urine. If the amount of blood in your urine increases, call your health care provider.  If a tissue sample was removed for testing (biopsy) during your procedure, it is up to you to get your test results. Ask your health care provider, or the department that is doing the test, when your results will be ready.  Drink enough fluid to keep your urine pale yellow.  Keep all follow-up visits as told by your health care provider. This is important. Contact a health care provider if you:  Have pain that gets worse or does not get better with medicine, especially pain when you urinate.  Have trouble urinating.  Have more blood in your urine. Get help right away if you:  Have blood clots in your urine.  Have abdominal pain.  Have a fever or chills.  Are unable to urinate. Summary  Cystoscopy is a procedure that is used to help diagnose and sometimes treat conditions that affect the lower urinary tract.  Cystoscopy is done using   a thin, tube-shaped instrument with a light and camera at the end.  After the procedure, it is common to have some soreness or pain in your abdomen and urethra.  Watch for any blood in your urine. If the amount of blood in your urine increases, call your health care provider.  If you were prescribed an antibiotic medicine, take it as told by your health care provider. Do not stop taking the antibiotic even if you start to feel better. This  information is not intended to replace advice given to you by your health care provider. Make sure you discuss any questions you have with your health care provider. Document Revised: 08/19/2018 Document Reviewed: 08/19/2018 Elsevier Patient Education  2020 Elsevier Inc.   

## 2020-07-13 DIAGNOSIS — E119 Type 2 diabetes mellitus without complications: Secondary | ICD-10-CM | POA: Diagnosis not present

## 2020-07-20 DIAGNOSIS — Z23 Encounter for immunization: Secondary | ICD-10-CM | POA: Diagnosis not present

## 2020-07-20 DIAGNOSIS — R7303 Prediabetes: Secondary | ICD-10-CM | POA: Diagnosis not present

## 2020-07-20 DIAGNOSIS — Z6841 Body Mass Index (BMI) 40.0 and over, adult: Secondary | ICD-10-CM | POA: Diagnosis not present

## 2020-07-20 DIAGNOSIS — E7800 Pure hypercholesterolemia, unspecified: Secondary | ICD-10-CM | POA: Diagnosis not present

## 2020-07-20 DIAGNOSIS — Z0000 Encounter for general adult medical examination without abnormal findings: Secondary | ICD-10-CM | POA: Diagnosis not present

## 2020-07-20 DIAGNOSIS — Z20822 Contact with and (suspected) exposure to covid-19: Secondary | ICD-10-CM | POA: Diagnosis not present

## 2020-07-25 ENCOUNTER — Other Ambulatory Visit: Payer: PPO | Admitting: Urology

## 2020-08-08 ENCOUNTER — Telehealth: Payer: Self-pay | Admitting: Urology

## 2020-08-08 NOTE — Telephone Encounter (Signed)
Spoke to patient na d she states she is feeling much better. She states the cream seems to be working well.

## 2020-08-08 NOTE — Telephone Encounter (Signed)
Courtney Cherry cancelled her cystoscopy appointment.  Would you please call her and see if she is still experiencing the dysuria, suprapubic pain and back pain?  If she is still having her symptoms, I strongly recommend that she have a cystoscopy.

## 2020-08-11 ENCOUNTER — Ambulatory Visit
Admission: EM | Admit: 2020-08-11 | Discharge: 2020-08-11 | Disposition: A | Discharge status: Home / Self Care | Payer: PPO | Attending: Family Medicine | Admitting: Family Medicine

## 2020-08-11 ENCOUNTER — Other Ambulatory Visit: Payer: Self-pay

## 2020-08-11 ENCOUNTER — Ambulatory Visit (INDEPENDENT_AMBULATORY_CARE_PROVIDER_SITE_OTHER): Payer: PPO

## 2020-08-11 DIAGNOSIS — R059 Cough, unspecified: Secondary | ICD-10-CM

## 2020-08-11 DIAGNOSIS — R509 Fever, unspecified: Secondary | ICD-10-CM

## 2020-08-11 MED ORDER — ACETAMINOPHEN 325 MG PO TABS
650.0000 mg | ORAL_TABLET | Freq: Once | ORAL | Status: AC
  Administered 2020-08-11: 650 mg via ORAL

## 2020-08-11 MED ORDER — AZITHROMYCIN 250 MG PO TABS: ORAL_TABLET | ORAL | 0 refills | Status: DC

## 2020-08-11 NOTE — ED Provider Notes (Signed)
Courtney Cherry    CSN: [id number] Arrival date & time: 08/11/20  1015      History   Chief Complaint No chief complaint on file.   HPI Courtney Cherry is a 74 y.o. female.   Patient is a 74 year old female with past medical history of anemia, arthritis, seizures, chronic kidney disease, lower back pain, degenerative disc disease, depression, diabetes, high cholesterol,.  She presents today with approximately 5 days of worsening sinus pressure, thick mucus, fever, greenish phlegm.  Started Saturday.  Fever started on Tuesday.  Has been using Allegra, cough medication as well with no relief.  Has chronic sinus issues and believes this to be a sinus infection.  She has also had weakness.      Past Medical History:  Diagnosis Date  . Anemia 1951  . Arthritis   . C. difficile diarrhea   . Chronic kidney disease    chronic kidney disease Stage 3   . Chronic lower back pain   . Colon polyp    family history  . DDD (degenerative disc disease), lumbar   . Depression   . Diabetes mellitus without complication ([place])   . Eczema   . Fibromyalgia    "went away after I stopped using artificial sweeteners"  . Hypercholesteremia   . Jaundice    "w/gallstones"  . Obesity   . Obesity   . Osteoarthritis    lumbar spine and knees  . Osteopenia   . Pneumonia ~ 2002; 1995  . Polyp of colon, adenomatous   . Pulmonary nodule   . Shingles   . Shortness of breath    exertion due to back  . Urinary urgency   . Venous stasis   . Vitamin D deficiency   . Vitamin D deficiency     Patient Active Problem List   Diagnosis Date Noted  . Recurrent UTI 09/12/2018  . Post-menopause atrophic vaginitis 04/02/2018  . Hx of adenomatous polyp of colon 10/25/2017  . Intermittent vertigo 06/17/2017  . Seasonal allergic rhinitis 06/17/2017  . Morbid obesity with BMI of 40.0-44.9, adult ([place]) 04/24/2017  . Pure hypercholesterolemia 03/22/2017  . FH: colon polyps 06/12/2016  . Hx of  Clostridium difficile infection 06/12/2016  . CKD (chronic kidney disease) stage 3, GFR 30-59 ml/min ([facility]) 12/05/2015  . Lumbar degenerative disc disease 12/05/2015  . Osteopenia 12/05/2015  . Presence of left artificial knee joint 12/22/2014  . Incidental pulmonary nodule, > 58mm and < 22mm 06/15/2014  . Primary osteoarthritis of left knee 05/11/2014  . Diet-controlled diabetes mellitus ([place]) 05/11/2014  . Vitamin D deficiency 04/02/2014    Past Surgical History:  Procedure Laterality Date  . ABDOMINAL HYSTERECTOMY  1974  . [place]; 1999   left screws and plate 1997  and removed 1999  . BACK SURGERY     fusion 2008 L4-L5  . BACK SURGERY    . BREAST BIOPSY Left 2000s   NEG  . CATARACT EXTRACTION W/ INTRAOCULAR LENS  IMPLANT, BILATERAL  ~ 2009  . CHOLECYSTECTOMY  1966  . COLONOSCOPY WITH PROPOFOL N/A 09/05/2016   Procedure: COLONOSCOPY WITH PROPOFOL;  Surgeon: Manya Silvas, MD;  Location[facility] ENDOSCOPY;  Service: Endoscopy;  Laterality: N/A;  . COLONOSCOPY WITH PROPOFOL N/A 01/29/2018   Procedure: COLONOSCOPY WITH PROPOFOL;  Surgeon: Manya Silvas, MD;  Location: [facility] ENDOSCOPY;  Service: Endoscopy;  Laterality: N/A;  . EYE SURGERY     3 surgeries left 2010,and 1 right eye 2009  .  FRACTURE SURGERY    . JOINT REPLACEMENT Left 12/08/2014  . KNEE ARTHROSCOPY  2006   left   . 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  . MICRODISCECTOMY LUMBAR  02/2011  . NASAL RECONSTRUCTION WITH SEPTAL REPAIR    . OOPHORECTOMY     one ovary left  . ORIF ANKLE FRACTURE     left ankle reconstuction  . POSTERIOR FUSION LUMBAR SPINE  02/01/12  . TONSILLECTOMY  1953  . TOTAL KNEE ARTHROPLASTY Left     OB History   No obstetric history on file.      Home Medications    Prior to Admission medications   Medication Sig Start Date End Date Taking? Authorizing Provider  azithromycin (ZITHROMAX Z-PAK) 250 MG tablet As directed on box  08/11/20   Loura Halt A, NP  conjugated estrogens (PREMARIN) vaginal cream Apply 0.5mg  (pea-sized amount)  just inside the vaginal introitus with a finger-tip on  Monday, Wednesday and Friday nights. 06/09/20   McGowan, Larene Beach A, PA-C  ELDERBERRY PO Take by mouth.    [provider]  meclizine (ANTIVERT) 25 MG tablet Take 1 tablet (25 mg total) by mouth 3 (three) times daily as needed for dizziness. 03/26/19   Park Liter P, DO  Probiotic Product (PROBIOTIC ADVANCED PO) Take by mouth.    [provider]    Family History Family History  Problem Relation Age of Onset  . Breast cancer Paternal Grandmother 66       and 55  . Cystic fibrosis Mother   . Stroke Mother   . Asthma Father   . Anesthesia problems Neg Hx   . Hypotension Neg Hx   . Malignant hyperthermia Neg Hx   . Pseudochol deficiency Neg Hx     Social History Social History   Tobacco Use  . Smoking status: Never Smoker  . Smokeless tobacco: Never Used  Vaping Use  . Vaping Use: Never used  Substance Use Topics  . Alcohol use: No  . Drug use: No     Allergies   Sulfa antibiotics, Adhesive [tape], Valium [diazepam], and Castor oil   Review of Systems Review of Systems   Physical Exam Triage Vital Signs ED Triage Vitals  Enc Vitals Group     BP 08/11/20 1031 97/62     Pulse Rate 08/11/20 1031 96     Resp 08/11/20 1031 18     Temp 08/11/20 1031 (!) 100.8 F (38.2 C)     Temp Source 08/11/20 1031 Oral     SpO2 08/11/20 1031 94 %     Weight --      Height --      Head Circumference --      Peak Flow --      Pain Score 08/11/20 1028 0     Pain Loc --      Pain Edu? --      Excl. in [place]? --    No data found.  Updated Vital Signs BP 97/62 (BP Location: Left Arm)   Pulse 96   Temp (!) 100.8 F (38.2 C) (Oral)   Resp 18   SpO2 94%   Visual Acuity Right Eye Distance:   Left Eye Distance:   Bilateral Distance:    Right Eye Near:   Left Eye Near:    Bilateral Near:      Physical Exam Vitals and nursing note reviewed.  Constitutional:  General: She is not in acute distress.    Appearance: Normal appearance. She is not ill-appearing, toxic-appearing or diaphoretic.  HENT:     Head: Normocephalic.     Nose: Nose normal.  Eyes:     Conjunctiva/sclera: Conjunctivae normal.  Pulmonary:     Effort: Pulmonary effort is normal.     Breath sounds: Rales present.     Comments: Rales in bases Musculoskeletal:        General: Normal range of motion.     Cervical back: Normal range of motion.  Skin:    General: Skin is warm and dry.     Findings: No rash.  Neurological:     Mental Status: She is alert.  Psychiatric:        Mood and Affect: Mood normal.      UC Treatments / Results  Labs (all labs ordered are listed, but only abnormal results are displayed) Labs Reviewed  NOVEL CORONAVIRUS, NAA    EKG   Radiology DG Chest 2 View  Result Date: 08/11/2020 CLINICAL DATA:  Cough, fever EXAM: CHEST - 2 VIEW COMPARISON:  2015 FINDINGS: Interstitial prominence is increased since the prior study. No pleural effusion or pneumothorax. Cardiomediastinal contours remain within normal limits with normal heart size. IMPRESSION: Increased interstitial prominence, which may reflect edema or atypical infection. Electronically Signed   By: Macy Mis M.D.   On: 08/11/2020 11:23    Procedures Procedures (including critical care time)  Medications Ordered in UC Medications  acetaminophen (TYLENOL) tablet 650 mg (650 mg Oral Given 08/11/20 1051)    Initial Impression / Assessment and Plan / UC Course  I have reviewed the triage vital signs and the nursing notes.  Pertinent labs & imaging results that were available during my care of the patient were reviewed by me and considered in my medical decision making (see chart for details).     Cough fever Pt with fever 100.8 here today. Tylenol given.  Rales on exam X ray with increased interstitial  prominence, which may reflect edema or atypical infection. Based on the fever and exam we will go ahead and cover for infection.  There is concern for viral pneumonia, possibly covid pneumonia.  Pt is refusing covid testing and requesting antibiotics.  Treating with azithromycin at this time.  Recommend over-the-counter cough medicine as needed along with Tylenol as needed We will have her follow-up for any continued or worsening symptoms.   Final Clinical Impressions(s) / UC Diagnoses   Final diagnoses:  Cough  Fever, unspecified     Discharge Instructions     Treating you for possible pneumonia and sinus infection Take the medicine as prescribed Tylenol for fever.  Also testing you for covid Follow up as needed for continued or worsening symptoms     ED Prescriptions    Medication Sig Dispense Auth. Provider   azithromycin (ZITHROMAX Z-PAK) 250 MG tablet As directed on box 6 tablet Dalyn Becker A, NP     PDMP not reviewed this encounter.   Orvan July, NP 08/11/20 1242

## 2020-08-11 NOTE — ED Triage Notes (Signed)
Patient presents to Urgent Care with complaints of a sinus infection, fever (last temp  100.7), generalized aches, productive cough describes it as greenish phlegm. Symptoms started last Saturday, fever Tuesday. Reports a hx of sinus infections usually treats with saline but ran out of flushes. Taken allegra, cough medicine, and Advil with no relief.

## 2020-08-11 NOTE — Discharge Instructions (Addendum)
Treating you for possible pneumonia and sinus infection Take the medicine as prescribed Tylenol for fever.  Also testing you for covid Follow up as needed for continued or worsening symptoms

## 2020-08-16 DIAGNOSIS — R5383 Other fatigue: Secondary | ICD-10-CM | POA: Diagnosis not present

## 2020-08-16 DIAGNOSIS — Z20822 Contact with and (suspected) exposure to covid-19: Secondary | ICD-10-CM | POA: Diagnosis not present

## 2020-08-16 DIAGNOSIS — J3489 Other specified disorders of nose and nasal sinuses: Secondary | ICD-10-CM | POA: Diagnosis not present

## 2020-08-18 DIAGNOSIS — U071 COVID-19: Secondary | ICD-10-CM | POA: Diagnosis not present

## 2020-08-18 DIAGNOSIS — J208 Acute bronchitis due to other specified organisms: Secondary | ICD-10-CM | POA: Diagnosis not present

## 2020-08-25 DIAGNOSIS — J208 Acute bronchitis due to other specified organisms: Secondary | ICD-10-CM | POA: Diagnosis not present

## 2020-08-25 DIAGNOSIS — U071 COVID-19: Secondary | ICD-10-CM | POA: Diagnosis not present

## 2021-01-10 DIAGNOSIS — E7800 Pure hypercholesterolemia, unspecified: Secondary | ICD-10-CM | POA: Diagnosis not present

## 2021-01-10 DIAGNOSIS — R7303 Prediabetes: Secondary | ICD-10-CM | POA: Diagnosis not present

## 2021-01-17 ENCOUNTER — Other Ambulatory Visit: Payer: Self-pay | Admitting: Internal Medicine

## 2021-01-17 DIAGNOSIS — N1831 Chronic kidney disease, stage 3a: Secondary | ICD-10-CM | POA: Diagnosis not present

## 2021-01-17 DIAGNOSIS — Z6841 Body Mass Index (BMI) 40.0 and over, adult: Secondary | ICD-10-CM | POA: Diagnosis not present

## 2021-01-17 DIAGNOSIS — Z1231 Encounter for screening mammogram for malignant neoplasm of breast: Secondary | ICD-10-CM | POA: Diagnosis not present

## 2021-01-17 DIAGNOSIS — R7303 Prediabetes: Secondary | ICD-10-CM | POA: Diagnosis not present

## 2021-01-17 DIAGNOSIS — E7800 Pure hypercholesterolemia, unspecified: Secondary | ICD-10-CM | POA: Diagnosis not present

## 2021-02-13 ENCOUNTER — Other Ambulatory Visit: Payer: Self-pay

## 2021-02-13 ENCOUNTER — Ambulatory Visit
Admission: RE | Admit: 2021-02-13 | Discharge: 2021-02-13 | Disposition: A | Discharge status: Home / Self Care | Payer: PPO | Source: Ambulatory Visit | Attending: Internal Medicine | Admitting: Internal Medicine

## 2021-02-13 DIAGNOSIS — Z1231 Encounter for screening mammogram for malignant neoplasm of breast: Secondary | ICD-10-CM | POA: Insufficient documentation

## 2021-04-06 DIAGNOSIS — R8290 Unspecified abnormal findings in urine: Secondary | ICD-10-CM | POA: Diagnosis not present

## 2021-04-12 DIAGNOSIS — H2129 Other iris atrophy: Secondary | ICD-10-CM | POA: Diagnosis not present

## 2021-04-26 DIAGNOSIS — N182 Chronic kidney disease, stage 2 (mild): Secondary | ICD-10-CM | POA: Diagnosis not present

## 2021-05-01 DIAGNOSIS — N3090 Cystitis, unspecified without hematuria: Secondary | ICD-10-CM | POA: Diagnosis not present

## 2021-05-01 DIAGNOSIS — N182 Chronic kidney disease, stage 2 (mild): Secondary | ICD-10-CM | POA: Diagnosis not present

## 2021-06-01 DIAGNOSIS — R110 Nausea: Secondary | ICD-10-CM | POA: Diagnosis not present

## 2021-06-01 DIAGNOSIS — R42 Dizziness and giddiness: Secondary | ICD-10-CM | POA: Diagnosis not present

## 2021-06-01 DIAGNOSIS — J019 Acute sinusitis, unspecified: Secondary | ICD-10-CM | POA: Diagnosis not present

## 2021-07-17 DIAGNOSIS — R7303 Prediabetes: Secondary | ICD-10-CM | POA: Diagnosis not present

## 2021-07-17 DIAGNOSIS — Z6841 Body Mass Index (BMI) 40.0 and over, adult: Secondary | ICD-10-CM | POA: Diagnosis not present

## 2021-07-17 DIAGNOSIS — E7800 Pure hypercholesterolemia, unspecified: Secondary | ICD-10-CM | POA: Diagnosis not present

## 2021-07-17 DIAGNOSIS — N1831 Chronic kidney disease, stage 3a: Secondary | ICD-10-CM | POA: Diagnosis not present

## 2021-07-24 DIAGNOSIS — Z6841 Body Mass Index (BMI) 40.0 and over, adult: Secondary | ICD-10-CM | POA: Diagnosis not present

## 2021-07-24 DIAGNOSIS — R7303 Prediabetes: Secondary | ICD-10-CM | POA: Diagnosis not present

## 2021-07-24 DIAGNOSIS — R053 Chronic cough: Secondary | ICD-10-CM | POA: Diagnosis not present

## 2021-07-24 DIAGNOSIS — E7800 Pure hypercholesterolemia, unspecified: Secondary | ICD-10-CM | POA: Diagnosis not present

## 2021-07-24 DIAGNOSIS — Z0000 Encounter for general adult medical examination without abnormal findings: Secondary | ICD-10-CM | POA: Diagnosis not present

## 2021-07-24 DIAGNOSIS — N1831 Chronic kidney disease, stage 3a: Secondary | ICD-10-CM | POA: Diagnosis not present

## 2021-10-17 DIAGNOSIS — S0502XA Injury of conjunctiva and corneal abrasion without foreign body, left eye, initial encounter: Secondary | ICD-10-CM | POA: Diagnosis not present

## 2021-10-30 DIAGNOSIS — J4521 Mild intermittent asthma with (acute) exacerbation: Secondary | ICD-10-CM | POA: Diagnosis not present

## 2021-10-30 DIAGNOSIS — J4520 Mild intermittent asthma, uncomplicated: Secondary | ICD-10-CM | POA: Diagnosis not present

## 2021-11-27 DIAGNOSIS — R7303 Prediabetes: Secondary | ICD-10-CM | POA: Diagnosis not present

## 2021-11-27 DIAGNOSIS — B351 Tinea unguium: Secondary | ICD-10-CM | POA: Diagnosis not present

## 2021-11-27 DIAGNOSIS — L600 Ingrowing nail: Secondary | ICD-10-CM | POA: Diagnosis not present

## 2021-11-27 DIAGNOSIS — M79675 Pain in left toe(s): Secondary | ICD-10-CM | POA: Diagnosis not present

## 2022-01-09 DIAGNOSIS — R7303 Prediabetes: Secondary | ICD-10-CM | POA: Diagnosis not present

## 2022-01-09 DIAGNOSIS — E538 Deficiency of other specified B group vitamins: Secondary | ICD-10-CM | POA: Diagnosis not present

## 2022-01-09 DIAGNOSIS — E7800 Pure hypercholesterolemia, unspecified: Secondary | ICD-10-CM | POA: Diagnosis not present

## 2022-01-09 DIAGNOSIS — R6889 Other general symptoms and signs: Secondary | ICD-10-CM | POA: Diagnosis not present

## 2022-01-22 DIAGNOSIS — E1122 Type 2 diabetes mellitus with diabetic chronic kidney disease: Secondary | ICD-10-CM | POA: Diagnosis not present

## 2022-01-22 DIAGNOSIS — N1831 Chronic kidney disease, stage 3a: Secondary | ICD-10-CM | POA: Diagnosis not present

## 2022-01-22 DIAGNOSIS — E7800 Pure hypercholesterolemia, unspecified: Secondary | ICD-10-CM | POA: Diagnosis not present

## 2022-01-22 DIAGNOSIS — Z6841 Body Mass Index (BMI) 40.0 and over, adult: Secondary | ICD-10-CM | POA: Diagnosis not present

## 2022-01-23 ENCOUNTER — Other Ambulatory Visit: Payer: Self-pay | Admitting: Internal Medicine

## 2022-01-23 DIAGNOSIS — Z1231 Encounter for screening mammogram for malignant neoplasm of breast: Secondary | ICD-10-CM

## 2022-02-21 ENCOUNTER — Ambulatory Visit
Admission: RE | Admit: 2022-02-21 | Discharge: 2022-02-21 | Disposition: A | Discharge status: Home / Self Care | Payer: PPO | Source: Ambulatory Visit | Attending: Internal Medicine | Admitting: Internal Medicine

## 2022-02-21 DIAGNOSIS — Z1231 Encounter for screening mammogram for malignant neoplasm of breast: Secondary | ICD-10-CM | POA: Insufficient documentation

## 2022-05-03 DIAGNOSIS — N182 Chronic kidney disease, stage 2 (mild): Secondary | ICD-10-CM | POA: Diagnosis not present

## 2022-05-03 DIAGNOSIS — R8290 Unspecified abnormal findings in urine: Secondary | ICD-10-CM | POA: Diagnosis not present

## 2022-05-07 DIAGNOSIS — N182 Chronic kidney disease, stage 2 (mild): Secondary | ICD-10-CM | POA: Diagnosis not present

## 2022-05-07 DIAGNOSIS — N3090 Cystitis, unspecified without hematuria: Secondary | ICD-10-CM | POA: Diagnosis not present

## 2022-05-07 DIAGNOSIS — R600 Localized edema: Secondary | ICD-10-CM | POA: Diagnosis not present

## 2022-05-17 ENCOUNTER — Encounter: Payer: Self-pay | Admitting: Urology

## 2022-05-17 ENCOUNTER — Ambulatory Visit (INDEPENDENT_AMBULATORY_CARE_PROVIDER_SITE_OTHER): Payer: PPO | Admitting: Urology

## 2022-05-17 VITALS — BP 150/91 | HR 103 | Ht 63.0 in | Wt 249.0 lb

## 2022-05-17 DIAGNOSIS — Z87440 Personal history of urinary (tract) infections: Secondary | ICD-10-CM

## 2022-05-17 DIAGNOSIS — N390 Urinary tract infection, site not specified: Secondary | ICD-10-CM

## 2022-05-17 LAB — URINALYSIS, COMPLETE
Bilirubin, UA: NEGATIVE
Glucose, UA: NEGATIVE
Ketones, UA: NEGATIVE
Nitrite, UA: NEGATIVE
Protein,UA: NEGATIVE
RBC, UA: NEGATIVE
Specific Gravity, UA: 1.015 (ref 1.005–1.030)
Urobilinogen, Ur: 0.2 mg/dL (ref 0.2–1.0)
pH, UA: 5.5 (ref 5.0–7.5)

## 2022-05-17 LAB — MICROSCOPIC EXAMINATION: Epithelial Cells (non renal): >10 /hpf — AB (ref 0–10)

## 2022-05-17 NOTE — Progress Notes (Signed)
05/17/2022 2:18 PM   Courtney Cherry 02-06-1946 [id number]  Referring provider: Murlean Iba, MD [place],  [place] [address]  Chief Complaint  Patient presents with   Recurrent UTI    HPI: 76 y.o. female with a history of recurrent UTIs presents for follow-up at the recommendation of nephrology.  She last saw Larene Beach in October 2021 Using vaginal estrogen twice weekly and cranberry supplements At her last visit October 2020 when she was having persistent dysuria with negative cultures and cystoscopy was recommended however her symptoms resolved and she elected not to pursue the procedure. She had marked decrease in the incidence of her urinary tract infections. Denies gross hematuria or flank/abdominal/pelvic pain She had positive urine cultures for E. coli July 2022 and August 2023.  She has atypical symptoms of back pain and chills Presently asymptomatic   PMH: Past Medical History:  Diagnosis Date   Anemia 1951   Arthritis    C. difficile diarrhea    Chronic kidney disease    chronic kidney disease Stage 3    Chronic lower back pain    Colon polyp    family history   DDD (degenerative disc disease), lumbar    Depression    Diabetes mellitus without complication ([place])    Eczema    Fibromyalgia    "went away after I stopped using artificial sweeteners"   Hypercholesteremia    Jaundice    "w/gallstones"   Obesity    Obesity    Osteoarthritis    lumbar spine and knees   Osteopenia    Pneumonia ~ 2002; 1995   Polyp of colon, adenomatous    Pulmonary nodule    Shingles    Shortness of breath    exertion due to back   Urinary urgency    Venous stasis    Vitamin D deficiency    Vitamin D deficiency     Surgical History: Past Surgical History:  Procedure Laterality Date   New [place]uwe; 1999   left screws and plate 1997  and removed [place]     fusion 2008 L4-L5    BACK SURGERY     BREAST BIOPSY Left 2000s   NEG   CATARACT EXTRACTION W/ INTRAOCULAR LENS  IMPLANT, BILATERAL  ~ 2009   CHOLECYSTECTOMY  1966   COLONOSCOPY WITH PROPOFOL N/A 09/05/2016   Procedure: COLONOSCOPY WITH PROPOFOL;  Surgeon: Manya Silvas, MD;  Location[facility] ENDOSCOPY;  Service: Endoscopy;  Laterality: N/A;   COLONOSCOPY WITH PROPOFOL N/A 01/29/2018   Procedure: COLONOSCOPY WITH PROPOFOL;  Surgeon: Manya Silvas, MD;  Location: [facility] ENDOSCOPY;  Service: Endoscopy;  Laterality: N/A;   EYE SURGERY     3 surgeries left 2010,and 1 right eye 2009   FRACTURE SURGERY     JOINT REPLACEMENT Left 12/08/2014   KNEE ARTHROSCOPY  2006   left    LEFT EYE  ?2011   H/O  TORN  IRIS ON LEFT  EYE....AFTER REDO CATARACT...   MICRODISCECTOMY LUMBAR  02/2011   NASAL RECONSTRUCTION WITH SEPTAL REPAIR     OOPHORECTOMY     one ovary left   ORIF ANKLE FRACTURE     left ankle reconstuction   POSTERIOR FUSION LUMBAR SPINE  02/01/12   TONSILLECTOMY  1953   TOTAL KNEE ARTHROPLASTY Left     Home Medications:  Allergies as of 05/17/2022       Reactions  Sulfa Antibiotics Rash   "gets severe cause I let it go so long"   Adhesive [tape] Other (See Comments)   "pulls my skin; drying; burns a little"   Valium [diazepam] Other (See Comments)   "I get hyper; can't sit still"   Castor Oil Other (See Comments)        Medication List        Accurate as of May 17, 2022  2:18 PM. If you have any questions, ask your nurse or doctor.          STOP taking these medications    azithromycin 250 MG tablet Commonly known as: Zithromax Z-Pak Stopped by: Abbie Sons, MD   PROBIOTIC ADVANCED PO Stopped by: Abbie Sons, MD       TAKE these medications    Cholecalciferol 25 MCG (1000 UT) capsule Take 1 capsule by mouth daily.   Cranberry 1000 MG Caps Take by mouth.   Cyanocobalamin 1000 MCG Caps Take 1 capsule by mouth daily.   ELDERBERRY PO Take by mouth.    meclizine 25 MG tablet Commonly known as: ANTIVERT Take 1 tablet (25 mg total) by mouth 3 (three) times daily as needed for dizziness.   Premarin vaginal cream Generic drug: conjugated estrogens Apply 0.'5mg'$  (pea-sized amount)  just inside the vaginal introitus with a finger-tip on  Monday, Wednesday and Friday nights.        Allergies:  Allergies  Allergen Reactions   Sulfa Antibiotics Rash    "gets severe cause I let it go so long"   Adhesive [Tape] Other (See Comments)    "pulls my skin; drying; burns a little"   Valium [Diazepam] Other (See Comments)    "I get hyper; can't sit still"   Castor Oil Other (See Comments)    Family History: Family History  Problem Relation Age of Onset   Breast cancer Paternal Grandmother 38       and 22   Cystic fibrosis Mother    Stroke Mother    Asthma Father    Breast cancer Other    Anesthesia problems Neg Hx    Hypotension Neg Hx    Malignant hyperthermia Neg Hx    Pseudochol deficiency Neg Hx     Social History:  reports that she has never smoked. She has never used smokeless tobacco. She reports that she does not drink alcohol and does not use drugs.   Physical Exam: BP (!) 150/91   Pulse (!) 103   Ht '5\' 3"'$  (1.6 m)   Wt 249 lb (112.9 kg)   BMI 44.11 kg/m   Constitutional:  Alert and oriented, No acute distress. HEENT: [place] AT Respiratory: Normal respiratory effort, no increased work of breathing. Neurologic: Grossly intact, no focal deficits, moving all 4 extremities. Psychiatric: Normal mood and affect.  Laboratory Data:  Urinalysis UA today with pyuria however significant vaginal contamination at >10 epis/hpf.  Since she is asymptomatic will not obtain a catheterized specimen   Assessment & Plan:    1. Recurrent UTI Currently having 1-2 UTIs per year On cranberry and vaginal estrogen Previous cystoscopy was recommended secondary to dysuria with negative cultures and this has resolved Cystoscopy is typically  low yield in menopausal woman with recurrent UTIs and is not recommended on the AUA guidelines Instructed to call for an acute visit for recurrent UTI symptoms Routine follow-up 6 months    Abbie Sons, MD  [facility] [address], [place], [place]  [address] [phone]  

## 2022-05-22 DIAGNOSIS — Z961 Presence of intraocular lens: Secondary | ICD-10-CM | POA: Diagnosis not present

## 2022-05-22 DIAGNOSIS — H5203 Hypermetropia, bilateral: Secondary | ICD-10-CM | POA: Diagnosis not present

## 2022-05-22 DIAGNOSIS — H26493 Other secondary cataract, bilateral: Secondary | ICD-10-CM | POA: Diagnosis not present

## 2022-06-08 ENCOUNTER — Encounter: Payer: Self-pay | Admitting: Urology

## 2022-06-19 DIAGNOSIS — R0981 Nasal congestion: Secondary | ICD-10-CM | POA: Diagnosis not present

## 2022-06-19 DIAGNOSIS — J329 Chronic sinusitis, unspecified: Secondary | ICD-10-CM | POA: Diagnosis not present

## 2022-06-19 DIAGNOSIS — R42 Dizziness and giddiness: Secondary | ICD-10-CM | POA: Diagnosis not present

## 2022-06-19 DIAGNOSIS — J309 Allergic rhinitis, unspecified: Secondary | ICD-10-CM | POA: Diagnosis not present

## 2022-07-03 DIAGNOSIS — J301 Allergic rhinitis due to pollen: Secondary | ICD-10-CM | POA: Diagnosis not present

## 2022-07-10 DIAGNOSIS — H8112 Benign paroxysmal vertigo, left ear: Secondary | ICD-10-CM | POA: Diagnosis not present

## 2022-07-10 DIAGNOSIS — H903 Sensorineural hearing loss, bilateral: Secondary | ICD-10-CM | POA: Diagnosis not present

## 2022-07-17 DIAGNOSIS — R42 Dizziness and giddiness: Secondary | ICD-10-CM | POA: Diagnosis not present

## 2022-07-20 DIAGNOSIS — N1831 Chronic kidney disease, stage 3a: Secondary | ICD-10-CM | POA: Diagnosis not present

## 2022-07-20 DIAGNOSIS — Z79899 Other long term (current) drug therapy: Secondary | ICD-10-CM | POA: Diagnosis not present

## 2022-07-20 DIAGNOSIS — E1122 Type 2 diabetes mellitus with diabetic chronic kidney disease: Secondary | ICD-10-CM | POA: Diagnosis not present

## 2022-07-20 DIAGNOSIS — Z6841 Body Mass Index (BMI) 40.0 and over, adult: Secondary | ICD-10-CM | POA: Diagnosis not present

## 2022-07-20 DIAGNOSIS — E7800 Pure hypercholesterolemia, unspecified: Secondary | ICD-10-CM | POA: Diagnosis not present

## 2022-07-23 DIAGNOSIS — J309 Allergic rhinitis, unspecified: Secondary | ICD-10-CM | POA: Diagnosis not present

## 2022-07-23 DIAGNOSIS — R42 Dizziness and giddiness: Secondary | ICD-10-CM | POA: Diagnosis not present

## 2022-07-27 DIAGNOSIS — N1831 Chronic kidney disease, stage 3a: Secondary | ICD-10-CM | POA: Diagnosis not present

## 2022-07-27 DIAGNOSIS — Z0000 Encounter for general adult medical examination without abnormal findings: Secondary | ICD-10-CM | POA: Diagnosis not present

## 2022-07-27 DIAGNOSIS — E7800 Pure hypercholesterolemia, unspecified: Secondary | ICD-10-CM | POA: Diagnosis not present

## 2022-07-27 DIAGNOSIS — E1122 Type 2 diabetes mellitus with diabetic chronic kidney disease: Secondary | ICD-10-CM | POA: Diagnosis not present

## 2022-07-27 DIAGNOSIS — Z6841 Body Mass Index (BMI) 40.0 and over, adult: Secondary | ICD-10-CM | POA: Diagnosis not present

## 2022-11-15 ENCOUNTER — Ambulatory Visit: Payer: PPO | Admitting: Urology

## 2022-11-23 ENCOUNTER — Ambulatory Visit: Payer: PPO | Admitting: Urology

## 2023-01-21 DIAGNOSIS — E7800 Pure hypercholesterolemia, unspecified: Secondary | ICD-10-CM | POA: Diagnosis not present

## 2023-01-21 DIAGNOSIS — N1831 Chronic kidney disease, stage 3a: Secondary | ICD-10-CM | POA: Diagnosis not present

## 2023-01-21 DIAGNOSIS — E1122 Type 2 diabetes mellitus with diabetic chronic kidney disease: Secondary | ICD-10-CM | POA: Diagnosis not present

## 2023-01-21 DIAGNOSIS — Z6841 Body Mass Index (BMI) 40.0 and over, adult: Secondary | ICD-10-CM | POA: Diagnosis not present

## 2023-01-23 DIAGNOSIS — R300 Dysuria: Secondary | ICD-10-CM | POA: Diagnosis not present

## 2023-01-29 ENCOUNTER — Other Ambulatory Visit: Payer: Self-pay | Admitting: Internal Medicine

## 2023-01-29 DIAGNOSIS — Z1231 Encounter for screening mammogram for malignant neoplasm of breast: Secondary | ICD-10-CM

## 2023-01-29 DIAGNOSIS — T466X5A Adverse effect of antihyperlipidemic and antiarteriosclerotic drugs, initial encounter: Secondary | ICD-10-CM | POA: Diagnosis not present

## 2023-01-29 DIAGNOSIS — N1831 Chronic kidney disease, stage 3a: Secondary | ICD-10-CM | POA: Diagnosis not present

## 2023-01-29 DIAGNOSIS — M791 Myalgia, unspecified site: Secondary | ICD-10-CM | POA: Diagnosis not present

## 2023-01-29 DIAGNOSIS — E1122 Type 2 diabetes mellitus with diabetic chronic kidney disease: Secondary | ICD-10-CM | POA: Diagnosis not present

## 2023-01-29 DIAGNOSIS — Z6841 Body Mass Index (BMI) 40.0 and over, adult: Secondary | ICD-10-CM | POA: Diagnosis not present

## 2023-01-29 DIAGNOSIS — E7800 Pure hypercholesterolemia, unspecified: Secondary | ICD-10-CM | POA: Diagnosis not present

## 2023-02-26 ENCOUNTER — Ambulatory Visit
Admission: RE | Admit: 2023-02-26 | Discharge: 2023-02-26 | Disposition: A | Discharge status: Home / Self Care | Payer: PPO | Source: Ambulatory Visit | Attending: Internal Medicine | Admitting: Internal Medicine

## 2023-02-26 ENCOUNTER — Other Ambulatory Visit: Payer: Self-pay | Admitting: Internal Medicine

## 2023-02-26 DIAGNOSIS — Z1231 Encounter for screening mammogram for malignant neoplasm of breast: Secondary | ICD-10-CM | POA: Insufficient documentation

## 2023-02-27 ENCOUNTER — Other Ambulatory Visit: Payer: Self-pay | Admitting: Internal Medicine

## 2023-02-27 DIAGNOSIS — N63 Unspecified lump in unspecified breast: Secondary | ICD-10-CM

## 2023-02-27 DIAGNOSIS — N644 Mastodynia: Secondary | ICD-10-CM

## 2023-03-04 ENCOUNTER — Ambulatory Visit
Admission: RE | Admit: 2023-03-04 | Discharge: 2023-03-04 | Disposition: A | Discharge status: Home / Self Care | Payer: PPO | Source: Ambulatory Visit | Attending: Internal Medicine | Admitting: Internal Medicine

## 2023-03-04 DIAGNOSIS — N644 Mastodynia: Secondary | ICD-10-CM | POA: Diagnosis not present

## 2023-03-04 DIAGNOSIS — N63 Unspecified lump in unspecified breast: Secondary | ICD-10-CM | POA: Diagnosis not present

## 2023-03-08 ENCOUNTER — Other Ambulatory Visit: Payer: Self-pay | Admitting: Internal Medicine

## 2023-03-08 DIAGNOSIS — Z1231 Encounter for screening mammogram for malignant neoplasm of breast: Secondary | ICD-10-CM

## 2023-04-11 DIAGNOSIS — Z03818 Encounter for observation for suspected exposure to other biological agents ruled out: Secondary | ICD-10-CM | POA: Diagnosis not present

## 2023-04-11 DIAGNOSIS — J069 Acute upper respiratory infection, unspecified: Secondary | ICD-10-CM | POA: Diagnosis not present

## 2023-04-11 DIAGNOSIS — U071 COVID-19: Secondary | ICD-10-CM | POA: Diagnosis not present

## 2023-04-11 DIAGNOSIS — H6692 Otitis media, unspecified, left ear: Secondary | ICD-10-CM | POA: Diagnosis not present

## 2023-04-11 DIAGNOSIS — E119 Type 2 diabetes mellitus without complications: Secondary | ICD-10-CM | POA: Diagnosis not present

## 2023-05-08 DIAGNOSIS — E119 Type 2 diabetes mellitus without complications: Secondary | ICD-10-CM | POA: Diagnosis not present

## 2023-05-08 DIAGNOSIS — Z961 Presence of intraocular lens: Secondary | ICD-10-CM | POA: Diagnosis not present

## 2023-05-08 DIAGNOSIS — H02889 Meibomian gland dysfunction of unspecified eye, unspecified eyelid: Secondary | ICD-10-CM | POA: Diagnosis not present

## 2023-05-08 DIAGNOSIS — H26493 Other secondary cataract, bilateral: Secondary | ICD-10-CM | POA: Diagnosis not present

## 2023-05-08 DIAGNOSIS — H1045 Other chronic allergic conjunctivitis: Secondary | ICD-10-CM | POA: Diagnosis not present

## 2023-06-06 DIAGNOSIS — H02884 Meibomian gland dysfunction left upper eyelid: Secondary | ICD-10-CM | POA: Diagnosis not present

## 2023-06-06 DIAGNOSIS — H02881 Meibomian gland dysfunction right upper eyelid: Secondary | ICD-10-CM | POA: Diagnosis not present

## 2023-06-06 DIAGNOSIS — H1045 Other chronic allergic conjunctivitis: Secondary | ICD-10-CM | POA: Diagnosis not present

## 2023-07-09 DIAGNOSIS — E1122 Type 2 diabetes mellitus with diabetic chronic kidney disease: Secondary | ICD-10-CM | POA: Diagnosis not present

## 2023-07-09 DIAGNOSIS — N1831 Chronic kidney disease, stage 3a: Secondary | ICD-10-CM | POA: Diagnosis not present

## 2023-07-09 DIAGNOSIS — R197 Diarrhea, unspecified: Secondary | ICD-10-CM | POA: Diagnosis not present

## 2023-07-09 DIAGNOSIS — R109 Unspecified abdominal pain: Secondary | ICD-10-CM | POA: Diagnosis not present

## 2023-07-09 DIAGNOSIS — E7800 Pure hypercholesterolemia, unspecified: Secondary | ICD-10-CM | POA: Diagnosis not present

## 2023-07-09 DIAGNOSIS — R1084 Generalized abdominal pain: Secondary | ICD-10-CM | POA: Diagnosis not present

## 2023-07-11 DIAGNOSIS — R197 Diarrhea, unspecified: Secondary | ICD-10-CM | POA: Diagnosis not present

## 2023-07-24 DIAGNOSIS — J309 Allergic rhinitis, unspecified: Secondary | ICD-10-CM | POA: Diagnosis not present

## 2023-07-30 DIAGNOSIS — Z6841 Body Mass Index (BMI) 40.0 and over, adult: Secondary | ICD-10-CM | POA: Diagnosis not present

## 2023-07-30 DIAGNOSIS — G720 Drug-induced myopathy: Secondary | ICD-10-CM | POA: Diagnosis not present

## 2023-07-30 DIAGNOSIS — N1831 Chronic kidney disease, stage 3a: Secondary | ICD-10-CM | POA: Diagnosis not present

## 2023-07-30 DIAGNOSIS — E7800 Pure hypercholesterolemia, unspecified: Secondary | ICD-10-CM | POA: Diagnosis not present

## 2023-07-30 DIAGNOSIS — T466X5A Adverse effect of antihyperlipidemic and antiarteriosclerotic drugs, initial encounter: Secondary | ICD-10-CM | POA: Diagnosis not present

## 2023-07-30 DIAGNOSIS — Z0000 Encounter for general adult medical examination without abnormal findings: Secondary | ICD-10-CM | POA: Diagnosis not present

## 2023-07-30 DIAGNOSIS — E1122 Type 2 diabetes mellitus with diabetic chronic kidney disease: Secondary | ICD-10-CM | POA: Diagnosis not present

## 2024-01-21 DIAGNOSIS — E1122 Type 2 diabetes mellitus with diabetic chronic kidney disease: Secondary | ICD-10-CM | POA: Diagnosis not present

## 2024-01-21 DIAGNOSIS — N1831 Chronic kidney disease, stage 3a: Secondary | ICD-10-CM | POA: Diagnosis not present

## 2024-01-21 DIAGNOSIS — Z6841 Body Mass Index (BMI) 40.0 and over, adult: Secondary | ICD-10-CM | POA: Diagnosis not present

## 2024-01-21 DIAGNOSIS — E7800 Pure hypercholesterolemia, unspecified: Secondary | ICD-10-CM | POA: Diagnosis not present

## 2024-01-28 DIAGNOSIS — E1122 Type 2 diabetes mellitus with diabetic chronic kidney disease: Secondary | ICD-10-CM | POA: Diagnosis not present

## 2024-01-28 DIAGNOSIS — E7800 Pure hypercholesterolemia, unspecified: Secondary | ICD-10-CM | POA: Diagnosis not present

## 2024-01-28 DIAGNOSIS — N1831 Chronic kidney disease, stage 3a: Secondary | ICD-10-CM | POA: Diagnosis not present

## 2024-01-28 DIAGNOSIS — G720 Drug-induced myopathy: Secondary | ICD-10-CM | POA: Diagnosis not present

## 2024-01-28 DIAGNOSIS — Z6841 Body Mass Index (BMI) 40.0 and over, adult: Secondary | ICD-10-CM | POA: Diagnosis not present

## 2024-01-28 DIAGNOSIS — T466X5A Adverse effect of antihyperlipidemic and antiarteriosclerotic drugs, initial encounter: Secondary | ICD-10-CM | POA: Diagnosis not present

## 2024-02-04 DIAGNOSIS — Z111 Encounter for screening for respiratory tuberculosis: Secondary | ICD-10-CM | POA: Diagnosis not present

## 2024-04-20 DIAGNOSIS — G5601 Carpal tunnel syndrome, right upper limb: Secondary | ICD-10-CM | POA: Diagnosis not present

## 2024-04-20 DIAGNOSIS — M1811 Unilateral primary osteoarthritis of first carpometacarpal joint, right hand: Secondary | ICD-10-CM | POA: Diagnosis not present

## 2024-06-08 DIAGNOSIS — H1045 Other chronic allergic conjunctivitis: Secondary | ICD-10-CM | POA: Diagnosis not present

## 2024-06-08 DIAGNOSIS — H26493 Other secondary cataract, bilateral: Secondary | ICD-10-CM | POA: Diagnosis not present

## 2024-06-08 DIAGNOSIS — E119 Type 2 diabetes mellitus without complications: Secondary | ICD-10-CM | POA: Diagnosis not present

## 2024-06-08 DIAGNOSIS — H02881 Meibomian gland dysfunction right upper eyelid: Secondary | ICD-10-CM | POA: Diagnosis not present

## 2024-06-08 DIAGNOSIS — H02884 Meibomian gland dysfunction left upper eyelid: Secondary | ICD-10-CM | POA: Diagnosis not present

## 2024-07-24 DIAGNOSIS — E1122 Type 2 diabetes mellitus with diabetic chronic kidney disease: Secondary | ICD-10-CM | POA: Diagnosis not present

## 2024-07-24 DIAGNOSIS — Z6841 Body Mass Index (BMI) 40.0 and over, adult: Secondary | ICD-10-CM | POA: Diagnosis not present

## 2024-07-24 DIAGNOSIS — E7800 Pure hypercholesterolemia, unspecified: Secondary | ICD-10-CM | POA: Diagnosis not present

## 2024-07-24 DIAGNOSIS — N1831 Chronic kidney disease, stage 3a: Secondary | ICD-10-CM | POA: Diagnosis not present

## 2024-07-31 DIAGNOSIS — E7800 Pure hypercholesterolemia, unspecified: Secondary | ICD-10-CM | POA: Diagnosis not present

## 2024-07-31 DIAGNOSIS — G720 Drug-induced myopathy: Secondary | ICD-10-CM | POA: Diagnosis not present

## 2024-07-31 DIAGNOSIS — Z6841 Body Mass Index (BMI) 40.0 and over, adult: Secondary | ICD-10-CM | POA: Diagnosis not present

## 2024-07-31 DIAGNOSIS — N1831 Chronic kidney disease, stage 3a: Secondary | ICD-10-CM | POA: Diagnosis not present

## 2024-07-31 DIAGNOSIS — R300 Dysuria: Secondary | ICD-10-CM | POA: Diagnosis not present

## 2024-07-31 DIAGNOSIS — E1122 Type 2 diabetes mellitus with diabetic chronic kidney disease: Secondary | ICD-10-CM | POA: Diagnosis not present

## 2024-07-31 DIAGNOSIS — Z0000 Encounter for general adult medical examination without abnormal findings: Secondary | ICD-10-CM | POA: Diagnosis not present

## 2024-07-31 DIAGNOSIS — Z1331 Encounter for screening for depression: Secondary | ICD-10-CM | POA: Diagnosis not present

## 2024-07-31 DIAGNOSIS — T466X5A Adverse effect of antihyperlipidemic and antiarteriosclerotic drugs, initial encounter: Secondary | ICD-10-CM | POA: Diagnosis not present
